# Patient Record
Sex: Female | Born: 1990 | Hispanic: Yes | Marital: Married | Smoking: Never smoker
Health system: Southern US, Community
[De-identification: ages and names within clinical notes are randomized; demographics above are authoritative.]

## PROBLEM LIST (undated history)

## (undated) ENCOUNTER — Inpatient Hospital Stay: Payer: Self-pay

## (undated) DIAGNOSIS — O139 Gestational [pregnancy-induced] hypertension without significant proteinuria, unspecified trimester: Secondary | ICD-10-CM

## (undated) HISTORY — PX: NO PAST SURGERIES: SHX2092

---

## 2014-08-24 NOTE — L&D Delivery Note (Signed)
Obstetrical Delivery Note   Date of Delivery:   07/04/2015 Primary OB:   [facility] Gestational Age[facility]ED[facility] 8026w2d [facility] Antepartum complications: no prenatal records available  Delivered By:   Farrel ConnersGUTIERREZ, Earlisha Sharples, CNM  Delivery Type:   spontaneous vaginal delivery  Procedure Details:   Patient arrived on unit with urge to push. She was complete with the fetal head at +2 station. SVD of a vigorous 6#6oz female infant at 2342 07/03/2015 over intact perineum. Anesthesia:    none Intrapartum complications: precipitous labor GBS:    unknown Laceration:    none Episiotomy:    none Placenta:    Via active 3rd stage. To pathology: no Estimated Blood Loss:  350 ml  Baby:    Liveborn female, Apgars 8/9, weight 6#6oz    Samia Kukla, CNM

## 2014-11-18 ENCOUNTER — Emergency Department: Payer: Self-pay | Admitting: Internal Medicine

## 2014-11-18 LAB — CBC WITH DIFFERENTIAL/PLATELET
BASOS ABS: 0 10*3/uL (ref 0.0–0.1)
BASOS PCT: 0.7 %
Eosinophil #: 0.2 10*3/uL (ref 0.0–0.7)
Eosinophil %: 3.6 %
HCT: 38.7 % (ref 35.0–47.0)
HGB: 12.3 g/dL (ref 12.0–16.0)
LYMPHS ABS: 1.9 10*3/uL (ref 1.0–3.6)
Lymphocyte %: 32.1 %
MCH: 25.8 pg — ABNORMAL LOW (ref 26.0–34.0)
MCHC: 31.8 g/dL — AB (ref 32.0–36.0)
MCV: 81 fL (ref 80–100)
MONOS PCT: 7.9 %
Monocyte #: 0.5 x10 3/mm (ref 0.2–0.9)
NEUTROS ABS: 3.3 10*3/uL (ref 1.4–6.5)
Neutrophil %: 55.7 %
Platelet: 268 10*3/uL (ref 150–440)
RBC: 4.78 10*6/uL (ref 3.80–5.20)
RDW: 15.2 % — ABNORMAL HIGH (ref 11.5–14.5)
WBC: 5.9 10*3/uL (ref 3.6–11.0)

## 2014-11-18 LAB — URINALYSIS, COMPLETE
BILIRUBIN, UR: NEGATIVE
Blood: NEGATIVE
GLUCOSE, UR: NEGATIVE mg/dL (ref 0–75)
Ketone: NEGATIVE
Nitrite: NEGATIVE
Ph: 5 (ref 4.5–8.0)
Protein: NEGATIVE
RBC,UR: 2 /HPF (ref 0–5)
Specific Gravity: 1.018 (ref 1.003–1.030)
Squamous Epithelial: 3

## 2014-11-18 LAB — COMPREHENSIVE METABOLIC PANEL
Albumin: 4.3 g/dL
Alkaline Phosphatase: 77 U/L
Anion Gap: 6 — ABNORMAL LOW (ref 7–16)
BUN: 11 mg/dL
Bilirubin,Total: 0.4 mg/dL
Calcium, Total: 9.1 mg/dL
Chloride: 106 mmol/L
Co2: 22 mmol/L
Creatinine: 0.6 mg/dL
EGFR (African American): >60
Glucose: 80 mg/dL
Potassium: 4.2 mmol/L
SGOT(AST): 24 U/L
SGPT (ALT): 22 U/L
SODIUM: 134 mmol/L — AB
TOTAL PROTEIN: 7.5 g/dL

## 2014-11-18 LAB — GC/CHLAMYDIA PROBE AMP

## 2014-11-18 LAB — HCG, QUANTITATIVE, PREGNANCY: Beta Hcg, Quant.: 35638 m[IU]/mL — ABNORMAL HIGH

## 2014-11-18 LAB — LIPASE, BLOOD: Lipase: 33 U/L

## 2014-11-18 LAB — WET PREP, GENITAL

## 2015-05-07 ENCOUNTER — Emergency Department: Payer: Self-pay

## 2015-05-07 ENCOUNTER — Emergency Department
Admission: EM | Admit: 2015-05-07 | Discharge: 2015-05-07 | Disposition: A | Discharge status: Home / Self Care | Payer: Self-pay | Attending: Emergency Medicine | Admitting: Emergency Medicine

## 2015-05-07 ENCOUNTER — Inpatient Hospital Stay
Admission: EM | Admit: 2015-05-07 | Discharge: 2015-05-08 | Disposition: A | Discharge status: Home / Self Care | Payer: Self-pay | Source: Ambulatory Visit | Attending: Obstetrics and Gynecology | Admitting: Obstetrics and Gynecology

## 2015-05-07 ENCOUNTER — Encounter: Payer: Self-pay | Admitting: Emergency Medicine

## 2015-05-07 DIAGNOSIS — Z3A28 28 weeks gestation of pregnancy: Secondary | ICD-10-CM | POA: Insufficient documentation

## 2015-05-07 DIAGNOSIS — O26899 Other specified pregnancy related conditions, unspecified trimester: Secondary | ICD-10-CM | POA: Insufficient documentation

## 2015-05-07 DIAGNOSIS — O26893 Other specified pregnancy related conditions, third trimester: Secondary | ICD-10-CM

## 2015-05-07 DIAGNOSIS — N898 Other specified noninflammatory disorders of vagina: Secondary | ICD-10-CM | POA: Insufficient documentation

## 2015-05-07 DIAGNOSIS — O9989 Other specified diseases and conditions complicating pregnancy, childbirth and the puerperium: Secondary | ICD-10-CM | POA: Insufficient documentation

## 2015-05-07 DIAGNOSIS — Z3A00 Weeks of gestation of pregnancy not specified: Secondary | ICD-10-CM | POA: Insufficient documentation

## 2015-05-07 DIAGNOSIS — O4693 Antepartum hemorrhage, unspecified, third trimester: Secondary | ICD-10-CM | POA: Insufficient documentation

## 2015-05-07 LAB — COMPREHENSIVE METABOLIC PANEL
ALK PHOS: 93 U/L (ref 38–126)
ALT: 9 U/L — AB (ref 14–54)
ANION GAP: 7 (ref 5–15)
AST: 20 U/L (ref 15–41)
Albumin: 3.1 g/dL — ABNORMAL LOW (ref 3.5–5.0)
BUN: 12 mg/dL (ref 6–20)
CALCIUM: 8.8 mg/dL — AB (ref 8.9–10.3)
CO2: 22 mmol/L (ref 22–32)
CREATININE: 0.61 mg/dL (ref 0.44–1.00)
Chloride: 107 mmol/L (ref 101–111)
Glucose, Bld: 75 mg/dL (ref 65–99)
Potassium: 4.2 mmol/L (ref 3.5–5.1)
Sodium: 136 mmol/L (ref 135–145)
TOTAL PROTEIN: 6.8 g/dL (ref 6.5–8.1)
Total Bilirubin: 0.5 mg/dL (ref 0.3–1.2)

## 2015-05-07 LAB — CBC WITH DIFFERENTIAL/PLATELET
Basophils Absolute: 0 10*3/uL (ref 0–0.1)
Basophils Relative: 0 %
EOS ABS: 0.1 10*3/uL (ref 0–0.7)
EOS PCT: 1 %
HCT: 32.4 % — ABNORMAL LOW (ref 35.0–47.0)
HEMOGLOBIN: 10.6 g/dL — AB (ref 12.0–16.0)
LYMPHS ABS: 2.3 10*3/uL (ref 1.0–3.6)
LYMPHS PCT: 28 %
MCH: 25.2 pg — AB (ref 26.0–34.0)
MCHC: 32.6 g/dL (ref 32.0–36.0)
MCV: 77.4 fL — AB (ref 80.0–100.0)
MONOS PCT: 7 %
Monocytes Absolute: 0.6 10*3/uL (ref 0.2–0.9)
NEUTROS PCT: 64 %
Neutro Abs: 5.2 10*3/uL (ref 1.4–6.5)
Platelets: 197 10*3/uL (ref 150–440)
RBC: 4.19 MIL/uL (ref 3.80–5.20)
RDW: 14.2 % (ref 11.5–14.5)
WBC: 8.1 10*3/uL (ref 3.6–11.0)

## 2015-05-07 LAB — URINALYSIS COMPLETE WITH MICROSCOPIC (ARMC ONLY)
BACTERIA UA: NONE SEEN
BILIRUBIN URINE: NEGATIVE
GLUCOSE, UA: NEGATIVE mg/dL
Ketones, ur: NEGATIVE mg/dL
NITRITE: NEGATIVE
Protein, ur: NEGATIVE mg/dL
SPECIFIC GRAVITY, URINE: 1.005 (ref 1.005–1.030)
pH: 6 (ref 5.0–8.0)

## 2015-05-07 LAB — CHLAMYDIA/NGC RT PCR (ARMC ONLY)
CHLAMYDIA TR: NOT DETECTED
N GONORRHOEAE: NOT DETECTED

## 2015-05-07 LAB — TYPE AND SCREEN
ABO/RH(D): B POS
ANTIBODY SCREEN: NEGATIVE

## 2015-05-07 LAB — WET PREP, GENITAL
Trich, Wet Prep: NONE SEEN
Yeast Wet Prep HPF POC: NONE SEEN

## 2015-05-07 LAB — HCG, QUANTITATIVE, PREGNANCY: hCG, Beta Chain, Quant, S: 18246 m[IU]/mL — ABNORMAL HIGH (ref <5)

## 2015-05-07 NOTE — ED Notes (Signed)
Pt to ed with c/o white vaginal d/c today, pt is 7 months pregnant, reports pain in pelvic area as well.  Denies vaginal bleeding, sent from health department.

## 2015-05-07 NOTE — ED Provider Notes (Signed)
[facility] Emergency Department Provider Note     Time seen: ----------------------------------------- 6:08 PM on 05/07/2015 -----------------------------------------    I have reviewed the triage vital signs and the nursing notes.   HISTORY  Chief Complaint Vaginal Discharge    HPI Sheryl Stanley is a 24 y.o. female who presents ER for vaginal discharge today. Reports she is around 7 months pregnant, reports she's had some pain in the pelvic area as well. She denies significant vaginal bleeding, was sent from the health department for same.She was sent here for evaluation.   History reviewed. No pertinent past medical history.  There are no active problems to display for this patient.   History reviewed. No pertinent past surgical history.  Allergies Review of patient's allergies indicates no known allergies.  Social History Social History  Substance Use Topics  . Smoking status: Never Smoker   . Smokeless tobacco: None  . Alcohol Use: No    Review of Systems Constitutional: Negative for fever. Eyes: Negative for visual changes. ENT: Negative for sore throat. Cardiovascular: Negative for chest pain. Respiratory: Negative for shortness of breath. Gastrointestinal: Positive for abdominal pain, negative for vomiting and diarrhea. Genitourinary: Negative for dysuria. Patient with vaginal bleeding and discharge Musculoskeletal: Negative for back pain. Skin: Negative for rash. Neurological: Negative for headaches, focal weakness or numbness.  10-point ROS otherwise negative.  ____________________________________________   PHYSICAL EXAM:  VITAL SIGNS: ED Triage Vitals  Enc Vitals Group     BP 05/07/15 1721 132/67 mmHg     Pulse Rate 05/07/15 1721 72     Resp 05/07/15 1721 20     Temp 05/07/15 1721 98.5 F (36.9 C)     Temp Source 05/07/15 1721 Oral     SpO2 05/07/15 1721 100 %     Weight 05/07/15 1721 158 lb (71.668 kg)      Height 05/07/15 1721  (1.651 m)     Head Cir --      Peak Flow --      Pain Score 05/07/15 1722 4     Pain Loc --      Pain Edu? --      Excl. in GC? --     Constitutional: Alert and oriented. Well appearing and in no distress. Eyes: Conjunctivae are normal. PERRL. Normal extraocular movements. ENT   Head: Normocephalic and atraumatic.   Nose: No congestion/rhinnorhea.   Mouth/Throat: Mucous membranes are moist.   Neck: No stridor. Cardiovascular: Normal rate, regular rhythm. Normal and symmetric distal pulses are present in all extremities. No murmurs, rubs, or gallops. Respiratory: Normal respiratory effort without tachypnea nor retractions. Breath sounds are clear and equal bilaterally. No wheezes/rales/rhonchi. Gastrointestinal: Pelvic tenderness, no rebound or guarding. Normal bowel sounds. Genitourinary: Pelvic exam reveals copious white with discharge with yellowish tinge, friable cervix, slight bleeding. Os appears closed Musculoskeletal: Nontender with normal range of motion in all extremities. No joint effusions.  No lower extremity tenderness nor edema. Neurologic:  Normal speech and language. No gross focal neurologic deficits are appreciated. Speech is normal. No gait instability. Skin:  Skin is warm, dry and intact. No rash noted. Psychiatric: Mood and affect are normal. Speech and behavior are normal. Patient exhibits appropriate insight and judgment.  ____________________________________________  ED COURSE:  Pertinent labs & imaging results that were available during my care of the patient were reviewed by me and considered in my medical decision making (see chart for details). Patient will need ultrasound and likely monitoring for contractions.  ____________________________________________    LABS (pertinent positives/negatives)  Labs Reviewed  WET PREP, GENITAL  CHLAMYDIA/NGC RT PCR ([facility] ONLY)  URINALYSIS COMPLETEWITH  MICROSCOPIC ([facility] ONLY)  CBC WITH DIFFERENTIAL/PLATELET  COMPREHENSIVE METABOLIC PANEL  HCG, QUANTITATIVE, PREGNANCY  TYPE AND SCREEN    RADIOLOGY Images were viewed by me  Ultrasound OB limited  ____________________________________________  FINAL ASSESSMENT AND PLAN  Third trimester pregnancy, vaginal bleeding, abdominal pain  Plan: Patient with labs and imaging as dictated above. Final disposition is pending at this time. Likely she'll need observation and monitoring for contractions.   Emily Filbert, MD   Emily Filbert, MD 05/07/15 [phone]

## 2015-05-07 NOTE — ED Notes (Signed)
Pt is 7 months pregnant reports white vaginal discharge with pressure, pt denies any other symptoms

## 2015-05-07 NOTE — Discharge Instructions (Signed)
Will send urine for culture.  Patient to schedule next appt at [facility].  Call for problems or concerns.

## 2015-05-07 NOTE — Discharge Instructions (Signed)
Dolor abdominal durante el embarazo (Abdominal Pain During Pregnancy) El dolor de vientre (abdominal) es habitual durante el embarazo. Generalmente no se trata de un problema grave. Otras veces puede ser un signo de que algo no anda bien. Siempre comunquese con su mdico si tiene dolor abdominal. CUIDADOS EN EL HOGAR Controle el dolor para ver si hay cambios. Las indicaciones que siguen pueden ayudarla a sentirse mejor:  Hospital doctor (relaciones sexuales) ni se coloque nada dentro de la vagina hasta que se sienta mejor.  Haga reposo hasta que el dolor se calme.  Si siente ganas de vomitar (nuseas ) beba lquidos claros. No consuma alimentos slidos hasta que se sienta mejor.  Slo tome los medicamentos que le haya indicado su mdico.  Cumpla con las visitas al mdico segn las indicaciones. SOLICITE AYUDA DE INMEDIATO SI:   Tiene un sangrado, pierde lquido o elimina trozos de tejido por la vagina.  Siente ms dolor o clicos.  Comienza a vomitar.  Siente dolor al orinar u observa sangre en la orina.  Tiene fiebre.  No siente que el beb se mueva mucho.  Se siente muy dbil o cree que va a desmayarse.  Tiene dificultad para respirar con o sin dolor en el vientre.  Siente un dolor de cabeza muy intenso y Engineer, mining en el vientre.  Observa que sale un lquido por la vagina y tiene dolor abdominal.  La materia fecal es lquida (diarrea).  El dolor en el viente no desaparece, o empeora, luego de hacer reposo. ASEGRESE DE QUE:   Comprende estas instrucciones.  Controlar su afeccin.  Recibir ayuda de inmediato si no mejora o si empeora. Document Released: 04/22/2011 Document Revised: 04/12/2013 [facility] Patient Information 2015 [place], [place]. This information is not intended to replace advice given to you by your health care provider. Make sure you discuss any questions you have with your health care provider.  Hemorragia vaginal durante el embarazo (tercer  trimestre) (Vaginal Bleeding During Pregnancy, Third Trimester) Durante el embarazo es relativamente frecuente que se presente una pequea hemorragia (manchas). Hay diversos factores que pueden causar hemorragia o Games developer. A veces, la hemorragia es normal y no es un problema. No obstante, si esto sucede durante el tercer trimestre, puede ser un signo de afeccin grave de la madre y el beb. Debe informar a su mdico de inmediato si tiene alguna hemorragia vaginal.  Algunas causas posibles de hemorragia vaginal durante el tercer trimestre incluyen:   La placenta puede estar cubriendo la apertura del tero de [place] total o parcial (placenta previa).  La placenta puede haberse separado del tero (abrupcin placentaria).  Puede haber infeccin o una masa en el cuello del tero.  Puede ser que se est iniciando el trabajo de parto, entonces se denomina "prdida del tapn mucoso".  La placenta puede desarrollarse en la capa muscular del tero (placenta acreta). INSTRUCCIONES PARA EL CUIDADO EN EL HOGAR  Controle su afeccin para ver si hay cambios. Las siguientes indicaciones ayudarn a Psychologist, educational [employer] pueda sentir:   Siga las indicaciones del mdico para restringir su actividad. Si el mdico le indica descanso en la cama, debe quedarse en la cama y levantarse solo para ir al bao. No obstante, el mdico puede permitirle que contine realizando tareas livianas.  Si es necesario, organcese para que alguien le ayude con las actividades y responsabilidades cotidianas mientras est en cama.  Lleve un registro de la cantidad y Scientist, forensic de saturacin de las toallas higinicas que [place]  cada da. Anote este dato.  No use tampones.No se haga duchas vaginales.  No tenga relaciones sexuales u orgasmos hasta que el mdico la autorice.  Siga los consejos del mdico acerca de levantar objetos pesados, conducir y Education officer, environmental actividad fsica.  Si elimina tejido por la vagina,  gurdelo para mostrrselo al [employer].  Tusculum solo medicamentos de venta libre o recetados, segn las indicaciones del mdico.  No  tome aspirina, ya que puede causar hemorragias.  Cumpla con todas las visitas de control, segn le indique su mdico. SOLICITE ATENCIN MDICA SI:  Tiene un sangrado vaginal en cualquier momento del embarazo.  Tiene calambres o dolores de [place].  Tiene fiebre y no puede bajarla con medicamentos. SOLICITE ATENCIN MDICA DE INMEDIATO SI:   Siente calambres intensos o dolor en la espalda o en el vientre (abdomen).  Tiene escalofros.  Tiene una prdida de lquido por la vagina.  Elimina cogulos grandes o tejido por la vagina.  La hemorragia aumenta.  Se siente mareada o dbil.  Se desmaya.  Siente que el beb se mueve menos o no se mueve en absoluto. ASEGRESE DE QUE:  Comprende estas instrucciones.  Controlar su afeccin.  Recibir ayuda de inmediato si no mejora o si empeora. Document Released: 05/20/2005 Document Revised: 08/15/2013 [facility] Patient Information 2015 [place], [place]. This information is not intended to replace advice given to you by your health care provider. Make sure you discuss any questions you have with your health care provider.

## 2015-05-08 LAB — ABO/RH: ABO/RH(D): B POS

## 2015-05-08 NOTE — OB Triage Note (Signed)
Patient was sent from ED after in depth evaluation after having been sent from [facility] for vaginal d/c.  After efm tracing and cervical exam, patient d/c to home.  Urine culture sent and will request records in the morning from [facility].

## 2015-05-09 LAB — URINE CULTURE

## 2015-07-03 ENCOUNTER — Inpatient Hospital Stay
Admission: EM | Admit: 2015-07-03 | Discharge: 2015-07-05 | DRG: 775 | Disposition: A | Discharge status: Home / Self Care | Payer: Medicaid Other | Attending: Obstetrics & Gynecology | Admitting: Obstetrics & Gynecology

## 2015-07-03 DIAGNOSIS — O26893 Other specified pregnancy related conditions, third trimester: Secondary | ICD-10-CM | POA: Diagnosis present

## 2015-07-03 DIAGNOSIS — R030 Elevated blood-pressure reading, without diagnosis of hypertension: Secondary | ICD-10-CM | POA: Diagnosis present

## 2015-07-03 DIAGNOSIS — O24419 Gestational diabetes mellitus in pregnancy, unspecified control: Secondary | ICD-10-CM | POA: Diagnosis present

## 2015-07-03 DIAGNOSIS — O24429 Gestational diabetes mellitus in childbirth, unspecified control: Principal | ICD-10-CM | POA: Diagnosis present

## 2015-07-03 MED ORDER — OXYTOCIN 10 UNIT/ML IJ SOLN
INTRAMUSCULAR | Status: AC
  Administered 2015-07-03: 10 [IU] via INTRAMUSCULAR
  Filled 2015-07-03: qty 1

## 2015-07-04 LAB — CHLAMYDIA/NGC RT PCR (ARMC ONLY)
CHLAMYDIA TR: NOT DETECTED
N gonorrhoeae: NOT DETECTED

## 2015-07-04 LAB — CBC
HCT: 33 % — ABNORMAL LOW (ref 35.0–47.0)
Hemoglobin: 10.3 g/dL — ABNORMAL LOW (ref 12.0–16.0)
MCH: 22.4 pg — AB (ref 26.0–34.0)
MCHC: 31.3 g/dL — AB (ref 32.0–36.0)
MCV: 71.5 fL — ABNORMAL LOW (ref 80.0–100.0)
PLATELETS: 194 10*3/uL (ref 150–440)
RBC: 4.61 MIL/uL (ref 3.80–5.20)
RDW: 16.5 % — ABNORMAL HIGH (ref 11.5–14.5)
WBC: 10.2 10*3/uL (ref 3.6–11.0)

## 2015-07-04 LAB — COMPREHENSIVE METABOLIC PANEL
ALT: 13 U/L — ABNORMAL LOW (ref 14–54)
AST: 29 U/L (ref 15–41)
Albumin: 2.8 g/dL — ABNORMAL LOW (ref 3.5–5.0)
Alkaline Phosphatase: 174 U/L — ABNORMAL HIGH (ref 38–126)
Anion gap: 8 (ref 5–15)
BUN: 12 mg/dL (ref 6–20)
CHLORIDE: 108 mmol/L (ref 101–111)
CO2: 19 mmol/L — ABNORMAL LOW (ref 22–32)
Calcium: 8.9 mg/dL (ref 8.9–10.3)
Creatinine, Ser: 0.7 mg/dL (ref 0.44–1.00)
Glucose, Bld: 91 mg/dL (ref 65–99)
POTASSIUM: 4.3 mmol/L (ref 3.5–5.1)
Sodium: 135 mmol/L (ref 135–145)
Total Bilirubin: 0.2 mg/dL — ABNORMAL LOW (ref 0.3–1.2)
Total Protein: 6.7 g/dL (ref 6.5–8.1)

## 2015-07-04 LAB — PROTEIN / CREATININE RATIO, URINE
CREATININE, URINE: 57 mg/dL
Protein Creatinine Ratio: 1.28 mg/mg{Cre} — ABNORMAL HIGH (ref 0.00–0.15)
Total Protein, Urine: 73 mg/dL

## 2015-07-04 LAB — DIFFERENTIAL
BASOS PCT: 1 %
Basophils Absolute: 0.1 10*3/uL (ref 0–0.1)
Eosinophils Absolute: 0.1 10*3/uL (ref 0–0.7)
Eosinophils Relative: 1 %
Lymphocytes Relative: 20 %
Lymphs Abs: 2 10*3/uL (ref 1.0–3.6)
Monocytes Absolute: 0.6 10*3/uL (ref 0.2–0.9)
Monocytes Relative: 5 %
NEUTROS PCT: 73 %
Neutro Abs: 7.4 10*3/uL — ABNORMAL HIGH (ref 1.4–6.5)

## 2015-07-04 LAB — RAPID HIV SCREEN (HIV 1/2 AB+AG)
HIV 1/2 ANTIBODIES: NONREACTIVE
HIV-1 P24 ANTIGEN - HIV24: NONREACTIVE

## 2015-07-04 MED ORDER — DOCUSATE SODIUM 100 MG PO CAPS
100.0000 mg | ORAL_CAPSULE | Freq: Every day | ORAL | Status: DC
  Administered 2015-07-04 – 2015-07-05 (×2): 100 mg via ORAL
  Filled 2015-07-04 (×2): qty 1

## 2015-07-04 MED ORDER — SIMETHICONE 80 MG PO CHEW: 80.0000 mg | CHEWABLE_TABLET | ORAL | Status: DC | PRN

## 2015-07-04 MED ORDER — BENZOCAINE-MENTHOL 20-0.5 % EX AERO: 1.0000 "application " | INHALATION_SPRAY | CUTANEOUS | Status: DC | PRN

## 2015-07-04 MED ORDER — HYDROCODONE-ACETAMINOPHEN 5-325 MG PO TABS
1.0000 | ORAL_TABLET | ORAL | Status: DC | PRN
  Administered 2015-07-04 (×2): 2 via ORAL
  Filled 2015-07-04 (×2): qty 2

## 2015-07-04 MED ORDER — IBUPROFEN 600 MG PO TABS
600.0000 mg | ORAL_TABLET | Freq: Four times a day (QID) | ORAL | Status: DC
  Administered 2015-07-04 – 2015-07-05 (×6): 600 mg via ORAL
  Filled 2015-07-04 (×6): qty 1

## 2015-07-04 MED ORDER — DIBUCAINE 1 % RE OINT: 1.0000 "application " | TOPICAL_OINTMENT | RECTAL | Status: DC | PRN

## 2015-07-04 MED ORDER — ONDANSETRON HCL 4 MG PO TABS: 4.0000 mg | ORAL_TABLET | ORAL | Status: DC | PRN

## 2015-07-04 MED ORDER — PRENATAL MULTIVITAMIN CH
1.0000 | ORAL_TABLET | Freq: Every day | ORAL | Status: DC
  Administered 2015-07-04 – 2015-07-05 (×2): 1 via ORAL
  Filled 2015-07-04 (×2): qty 1

## 2015-07-04 MED ORDER — ONDANSETRON HCL 4 MG/2ML IJ SOLN: 4.0000 mg | INTRAMUSCULAR | Status: DC | PRN

## 2015-07-04 MED ORDER — OXYTOCIN 10 UNIT/ML IJ SOLN: 10.0000 [IU] | Freq: Once | INTRAMUSCULAR | Status: DC

## 2015-07-04 MED ORDER — FERROUS SULFATE 325 (65 FE) MG PO TABS
325.0000 mg | ORAL_TABLET | Freq: Every day | ORAL | Status: DC
  Administered 2015-07-04 – 2015-07-05 (×2): 325 mg via ORAL
  Filled 2015-07-04 (×2): qty 1

## 2015-07-04 MED ORDER — ONDANSETRON HCL 4 MG/2ML IJ SOLN: 4.0000 mg | Freq: Four times a day (QID) | INTRAMUSCULAR | Status: DC | PRN

## 2015-07-04 MED ORDER — WITCH HAZEL-GLYCERIN EX PADS: 1.0000 "application " | MEDICATED_PAD | CUTANEOUS | Status: DC | PRN

## 2015-07-04 NOTE — Progress Notes (Signed)
Post Partum Day 1 Subjective: Doing well, no complaints.  Tolerating regular diet, pain with PO meds, voiding and ambulating without difficulty.  No CP SOB F/C N/V or leg pain no HA change of vision, RUQ/epigastric pain  Objective: BP 110/70 mmHg  Pulse 74  Temp(Src) 98.7 F (37.1 C) (Oral)  Resp 18  SpO2 100%   Physical Exam:  General: NAD CV: RRR Pulm: nl effort, CTABL Lochia: moderate Uterine Fundus: fundus firm and below umbilicus DVT Evaluation: no cords, ttp LEs    Recent Labs  07/04/15 0035  HGB 10.3*  HCT 33.0*  WBC 10.2  PLT 194    Assessment/Plan: 23 y.o. [id number] postpartum day # 1  1. Doing well, continue routine post partum cares 2. No signs/symptoms of worsening pre-eclampsia. 3. Anticipate discharge tomorrow. 4. Still awaiting prenatal records.    ----- Ranae Plumberhelsea Ward, MD Attending Obstetrician and Gynecologist [facility]

## 2015-07-04 NOTE — H&P (Signed)
OB History & Physical   History of Present Illness:  Chief Complaint:  Feels urge to push. Contractions started around 2200 tonight HPI:  Sheryl Stanley is a 24 y.o. [id number] female at 6020w2d by 07/15/2015 EDC. She arrived on L&D completely dilated and delivered her baby a few minutes later.   [facility] and no records are available. Per interpretor, patient reports having elevated blood pressures this pregnancy and GDM but is on no medication for same  [facility]      Maternal Medical History:  No past medical history on file.  No past surgical history on file.  No Known Allergies  Prior to Admission medications   Medication Sig Start Date End Date Taking? Authorizing Provider  Prenatal Vit-Fe Fumarate-FA (MULTIVITAMIN-PRENATAL) 27-0.8 MG TABS tablet Take 1 tablet by mouth daily at 12 noon.   Yes Historical Provider, MD          Social History: She  reports that she has never smoked. She does not have any smokeless tobacco history on file. She reports that she does not drink alcohol or use illicit drugs.  Family History: family history is not on file.   Review of Systems: Negative x 10 systems reviewed except as noted in the HPI.      Physical Exam:  Vital Signs:  Patient Vitals for the past 24 hrs:  BP Pulse  07/04/15 0040 (!) 149/93 mmHg 80  07/04/15 0026 135/82 mmHg 83  07/04/15 0011 (!) 149/84 mmHg 73  07/04/15 0002 (!) 144/77 mmHg 82  07/03/15 2356 (!) 151/81 mmHg 71  07/03/15 2351 (!) 143/77 mmHg 76   General: presented with urge to push, now calm and relaxed post delivery HEENT: normocephalic, atraumatic Heart: regular rate & rhythm.  No murmurs Lungs: clear to auscultation bilaterally Abdomen:nontender Pelvic:    Extremities: non-tender, symmetric, traceedema bilaterally.  DTRs: +3 Neurologic: Alert & oriented x 3.    Pertinent Results:  Prenatal  Labs: Blood type/Rh B positive  Antibody screen   Rubella   RPR   HBsAg   HIV   GC negative  Chlamydia negative  Genetic screening   1 hour GTT   3 hour GTT   GBS unknown      Assessment:  Sheryl Stanley is a 24 y.o. [phone] female s/p precipitous labor Elevated blood pressures postpartum ?CHTN vs gestational hypertension  Plan:  1. Admit  2. Prenatal labs and PIH labs 3. Postpartum care-bottle feeding 4. Obtain prenatal records in AM.  Sheryl ConnersGUTIERREZ, COLLEEN  07/04/2015 12:33 AM    ----- Ranae Plumberhelsea Maizey Menendez, MD Attending Obstetrician and Gynecologist [facility]

## 2015-07-05 LAB — ABO/RH: ABO/RH(D): B POS

## 2015-07-05 LAB — HEPATITIS B SURFACE ANTIGEN: Hepatitis B Surface Ag: NEGATIVE

## 2015-07-05 LAB — RUBELLA SCREEN: Rubella: 1.4 index (ref >0.99)

## 2015-07-05 LAB — RPR: RPR Ser Ql: NONREACTIVE

## 2015-07-05 LAB — VARICELLA ZOSTER ANTIBODY, IGG: Varicella IgG: 295 index (ref >165)

## 2015-07-05 MED ORDER — IBUPROFEN 600 MG PO TABS: 600.0000 mg | ORAL_TABLET | Freq: Four times a day (QID) | ORAL | Status: DC

## 2015-07-05 MED ORDER — NORETHINDRONE 0.35 MG PO TABS: 1.0000 | ORAL_TABLET | Freq: Every day | ORAL | Status: DC

## 2015-07-05 NOTE — Discharge Instructions (Signed)
Call your doctor for increased pain or vaginal bleeding, temperature above 100.4, depression, or concerns.  No strenuous activity or heavy lifting for 6 weeks.  No intercourse, tampons, douching, or enemas for 6 weeks.  No tub baths-showers only.  No driving for 2 weeks or while taking pain medications.  Continue prenatal vitamin and iron.   °

## 2015-07-05 NOTE — Discharge Summary (Signed)
OB Discharge Summary  Patient Name: Sheryl Stanley DOB: 1990-12-27 MRN: [id number]  Date of admission: 07/03/2015 Delivering MD: Thomasene MohairStephen Adna Nofziger, MD Date of Delivery: 07/03/2015  Date of discharge: 07/05/2015  Admitting diagnosis: 40 weeks contractions Intrauterine pregnancy: 7768w2d     Secondary diagnosis: None     Discharge diagnosis: Term Pregnancy Delivered                                                                                                Post partum procedures:None  Augmentation: none  Complications: None  Hospital course:  Onset of Labor With Vaginal Delivery     24 y.o. yo [id number] at 6368w2d was admitted in Active Laboron 07/03/2015. Patient had an uncomplicated labor course as follows:  Membrane Rupture Time/Date: 11:42 PM ,07/03/2015   Intrapartum Procedures: Episiotomy: None [1]                                         Lacerations:  None [1]  Patient had a delivery of a Viable infant. 07/03/2015  Information for the patient's newborn:  Sheryl Stanley, Girl Sheryl Stanley [[id number]][[id number]]  Delivery Method: Vag-Spont     Pateint had an uncomplicated postpartum course.  She is ambulating, tolerating a regular diet, passing flatus, and urinating well. Patient is discharged home in stable condition on No discharge date for patient encounter.Marland Kitchen.    Physical exam  Filed Vitals:   07/04/15 1613 07/04/15 1930 07/04/15 2330 07/05/15 0734  BP: 106/56 122/80 119/67 103/66  Pulse: 70 77 72 71  Temp: 98.4 F (36.9 C) 98 F (36.7 C) 97.9 F (36.6 C) 98 F (36.7 C)  TempSrc:  Oral Oral Oral  Resp: 18 20 20 20   Height:      Weight:      SpO2:  100% 100% 100%   General: alert Lochia: appropriate Uterine Fundus: firm Incision: N/A DVT Evaluation: No evidence of DVT seen on physical exam.  Labs: Lab Results  Component Value Date   WBC 10.2 07/04/2015   HGB 10.3* 07/04/2015   HCT 33.0* 07/04/2015   MCV 71.5* 07/04/2015   PLT 194 07/04/2015   CMP Latest Ref Rng  07/04/2015  Glucose 65 - 99 mg/dL 91  BUN 6 - 20 mg/dL 12  Creatinine 1.300.44 - 8.651.00 mg/dL 7.840.70  Sodium 696135 - 295145 mmol/L 135  Potassium 3.5 - 5.1 mmol/L 4.3  Chloride 101 - 111 mmol/L 108  CO2 22 - 32 mmol/L 19(L)  Calcium 8.9 - 10.3 mg/dL 8.9  Total Protein 6.5 - 8.1 g/dL 6.7  Total Bilirubin 0.3 - 1.2 mg/dL 2.8(U0.2(L)  Alkaline Phos 38 - 126 U/L 174(H)  AST 15 - 41 U/L 29  ALT 14 - 54 U/L 13(L)    Discharge instruction: per After Visit Summary.  Medications:    Medication List    TAKE these medications        ibuprofen 600 MG tablet  Commonly known as:  ADVIL,MOTRIN  Take 1 tablet (600 mg total) by mouth every 6 (  six) hours.     multivitamin-prenatal 27-0.8 MG Tabs tablet  Take 1 tablet by mouth daily at 12 noon.     norethindrone 0.35 MG tablet  Commonly known as:  ORTHO MICRONOR  Take 1 tablet (0.35 mg total) by mouth daily.        Diet: routine diet  Activity: Advance as tolerated. Pelvic rest for 6 weeks.   Outpatient follow up: Follow-up Information    Follow up with [facility] In 6 weeks.   Why:  postpartum check   Contact information:   [address] [place] [address] [phone]      Postpartum contraception: Progesterone only pills Rhogam Given postpartum: no Rubella vaccine given postpartum: no Varicella vaccine given postpartum: no TDaP given antepartum or postpartum: TDaP per patient (no prenatal records available) Flu vaccine given antepartum or postpartum: yes per patient  Newborn Data: Live born female  Birth Weight: 6 lb 6.3 oz (2900 g) APGAR: 8, 9   Baby Feeding: Bottle, Breast and formula  Disposition:home with mother  Conard Novak, MD 07/05/2015 9:46 AM

## 2015-07-05 NOTE — Progress Notes (Addendum)
pt discharged home with infant.  Discharge instructions, prescriptions and follow up appointment given to and reviewed with pt.  Pt verbalized understanding, all questions answered.  Escorted by auxiliary. 

## 2015-07-05 NOTE — Care Management (Signed)
Received referral for wanting more information on Medicaid. Spoke with Sheryl Stanley at the bedside thru an interpreter. Explained that Ms. Sheryl Stanley will need to contact the Department of Social Services and arrange an appointment to start the application  process for baby girl "[name]." Hard copy of information given to Ms. Sheryl Clasivera.  Nurse updated. Gwenette GreetBrenda S Aldea Avis RN MSN CCM Care Management

## 2016-04-28 ENCOUNTER — Emergency Department
Admission: EM | Admit: 2016-04-28 | Discharge: 2016-04-28 | Discharge status: Against Medical Advice | Payer: Medicaid Other

## 2016-04-28 NOTE — ED Notes (Signed)
No answer when called from lobby 

## 2017-09-27 ENCOUNTER — Emergency Department: Payer: No Typology Code available for payment source

## 2017-09-27 ENCOUNTER — Other Ambulatory Visit: Payer: Self-pay

## 2017-09-27 ENCOUNTER — Emergency Department
Admission: EM | Admit: 2017-09-27 | Discharge: 2017-09-27 | Disposition: A | Discharge status: Home / Self Care | Payer: No Typology Code available for payment source | Attending: Emergency Medicine | Admitting: Emergency Medicine

## 2017-09-27 ENCOUNTER — Encounter: Payer: Self-pay | Admitting: Emergency Medicine

## 2017-09-27 DIAGNOSIS — R51 Headache: Secondary | ICD-10-CM | POA: Insufficient documentation

## 2017-09-27 DIAGNOSIS — Z79899 Other long term (current) drug therapy: Secondary | ICD-10-CM | POA: Insufficient documentation

## 2017-09-27 DIAGNOSIS — R1084 Generalized abdominal pain: Secondary | ICD-10-CM | POA: Insufficient documentation

## 2017-09-27 LAB — CBC
HEMATOCRIT: 34.3 % — AB (ref 35.0–47.0)
Hemoglobin: 11.5 g/dL — ABNORMAL LOW (ref 12.0–16.0)
MCH: 27.8 pg (ref 26.0–34.0)
MCHC: 33.4 g/dL (ref 32.0–36.0)
MCV: 83.1 fL (ref 80.0–100.0)
Platelets: 214 10*3/uL (ref 150–440)
RBC: 4.13 MIL/uL (ref 3.80–5.20)
RDW: 14.7 % — AB (ref 11.5–14.5)
WBC: 4.3 10*3/uL (ref 3.6–11.0)

## 2017-09-27 LAB — COMPREHENSIVE METABOLIC PANEL
ALT: 21 U/L (ref 14–54)
ANION GAP: 5 (ref 5–15)
AST: 33 U/L (ref 15–41)
Albumin: 4.2 g/dL (ref 3.5–5.0)
Alkaline Phosphatase: 73 U/L (ref 38–126)
BUN: 8 mg/dL (ref 6–20)
CO2: 25 mmol/L (ref 22–32)
Calcium: 9.3 mg/dL (ref 8.9–10.3)
Chloride: 110 mmol/L (ref 101–111)
Creatinine, Ser: 0.85 mg/dL (ref 0.44–1.00)
GFR calc Af Amer: >60 mL/min (ref >60)
Glucose, Bld: 100 mg/dL — ABNORMAL HIGH (ref 65–99)
POTASSIUM: 4.1 mmol/L (ref 3.5–5.1)
Sodium: 140 mmol/L (ref 135–145)
TOTAL PROTEIN: 7.8 g/dL (ref 6.5–8.1)
Total Bilirubin: 1.1 mg/dL (ref 0.3–1.2)

## 2017-09-27 LAB — POCT PREGNANCY, URINE: Preg Test, Ur: NEGATIVE

## 2017-09-27 MED ORDER — IBUPROFEN 600 MG PO TABS: 600.0000 mg | ORAL_TABLET | Freq: Four times a day (QID) | ORAL | 0 refills | Status: DC | PRN

## 2017-09-27 MED ORDER — IOPAMIDOL (ISOVUE-300) INJECTION 61%
100.0000 mL | Freq: Once | INTRAVENOUS | Status: AC | PRN
  Administered 2017-09-27: 100 mL via INTRAVENOUS
  Filled 2017-09-27: qty 100

## 2017-09-27 MED ORDER — IOPAMIDOL (ISOVUE-370) INJECTION 76%
100.0000 mL | Freq: Once | INTRAVENOUS | Status: DC | PRN
  Filled 2017-09-27: qty 100

## 2017-09-27 MED ORDER — CYCLOBENZAPRINE HCL 5 MG PO TABS: ORAL_TABLET | ORAL | 0 refills | Status: DC
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
## 2017-09-27 NOTE — ED Notes (Signed)
poc preg test  neg
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
## 2019-09-09 ENCOUNTER — Encounter: Payer: Self-pay | Admitting: Radiology

## 2019-09-09 ENCOUNTER — Emergency Department: Payer: Self-pay

## 2019-09-09 ENCOUNTER — Emergency Department
Admission: EM | Admit: 2019-09-09 | Discharge: 2019-09-09 | Disposition: A | Discharge status: Home / Self Care | Payer: Self-pay | Attending: Emergency Medicine | Admitting: Emergency Medicine

## 2019-09-09 ENCOUNTER — Other Ambulatory Visit: Payer: Self-pay

## 2019-09-09 DIAGNOSIS — R52 Pain, unspecified: Secondary | ICD-10-CM

## 2019-09-09 DIAGNOSIS — Y998 Other external cause status: Secondary | ICD-10-CM | POA: Insufficient documentation

## 2019-09-09 DIAGNOSIS — Y9389 Activity, other specified: Secondary | ICD-10-CM | POA: Insufficient documentation

## 2019-09-09 DIAGNOSIS — T07XXXA Unspecified multiple injuries, initial encounter: Secondary | ICD-10-CM

## 2019-09-09 DIAGNOSIS — M542 Cervicalgia: Secondary | ICD-10-CM | POA: Insufficient documentation

## 2019-09-09 DIAGNOSIS — M549 Dorsalgia, unspecified: Secondary | ICD-10-CM | POA: Insufficient documentation

## 2019-09-09 DIAGNOSIS — Z79899 Other long term (current) drug therapy: Secondary | ICD-10-CM | POA: Insufficient documentation

## 2019-09-09 DIAGNOSIS — Y92018 Other place in single-family (private) house as the place of occurrence of the external cause: Secondary | ICD-10-CM | POA: Insufficient documentation

## 2019-09-09 DIAGNOSIS — S3991XA Unspecified injury of abdomen, initial encounter: Secondary | ICD-10-CM

## 2019-09-09 DIAGNOSIS — S060X1A Concussion with loss of consciousness of 30 minutes or less, initial encounter: Secondary | ICD-10-CM

## 2019-09-09 DIAGNOSIS — H538 Other visual disturbances: Secondary | ICD-10-CM | POA: Insufficient documentation

## 2019-09-09 LAB — COMPREHENSIVE METABOLIC PANEL
ALT: 25 U/L (ref 0–44)
AST: 28 U/L (ref 15–41)
Albumin: 4.4 g/dL (ref 3.5–5.0)
Alkaline Phosphatase: 76 U/L (ref 38–126)
Anion gap: 13 (ref 5–15)
BUN: 10 mg/dL (ref 6–20)
CO2: 18 mmol/L — ABNORMAL LOW (ref 22–32)
Calcium: 9.3 mg/dL (ref 8.9–10.3)
Chloride: 113 mmol/L — ABNORMAL HIGH (ref 98–111)
Creatinine, Ser: 0.72 mg/dL (ref 0.44–1.00)
GFR calc Af Amer: >60 mL/min (ref >60)
GFR calc non Af Amer: >60 mL/min (ref >60)
Glucose, Bld: 105 mg/dL — ABNORMAL HIGH (ref 70–99)
Potassium: 3.6 mmol/L (ref 3.5–5.1)
Sodium: 144 mmol/L (ref 135–145)
Total Bilirubin: 0.3 mg/dL (ref 0.3–1.2)
Total Protein: 8.1 g/dL (ref 6.5–8.1)

## 2019-09-09 LAB — CBC WITH DIFFERENTIAL/PLATELET
Abs Immature Granulocytes: 0.01 10*3/uL (ref 0.00–0.07)
Basophils Absolute: 0 10*3/uL (ref 0.0–0.1)
Basophils Relative: 1 %
Eosinophils Absolute: 0.1 10*3/uL (ref 0.0–0.5)
Eosinophils Relative: 3 %
HCT: 37.6 % (ref 36.0–46.0)
Hemoglobin: 12 g/dL (ref 12.0–15.0)
Immature Granulocytes: 0 %
Lymphocytes Relative: 22 %
Lymphs Abs: 1 10*3/uL (ref 0.7–4.0)
MCH: 25.6 pg — ABNORMAL LOW (ref 26.0–34.0)
MCHC: 31.9 g/dL (ref 30.0–36.0)
MCV: 80.2 fL (ref 80.0–100.0)
Monocytes Absolute: 0.2 10*3/uL (ref 0.1–1.0)
Monocytes Relative: 5 %
Neutro Abs: 3 10*3/uL (ref 1.7–7.7)
Neutrophils Relative %: 69 %
Platelets: 289 10*3/uL (ref 150–400)
RBC: 4.69 MIL/uL (ref 3.87–5.11)
RDW: 15.5 % (ref 11.5–15.5)
WBC: 4.3 10*3/uL (ref 4.0–10.5)
nRBC: 0 % (ref 0.0–0.2)

## 2019-09-09 LAB — POCT PREGNANCY, URINE: Preg Test, Ur: NEGATIVE

## 2019-09-09 MED ORDER — IOHEXOL 300 MG/ML  SOLN
100.0000 mL | Freq: Once | INTRAMUSCULAR | Status: AC | PRN
  Administered 2019-09-09: 10:00:00 100 mL via INTRAVENOUS
  Filled 2019-09-09: qty 100

## 2019-09-09 MED ORDER — HYDROCODONE-ACETAMINOPHEN 5-325 MG PO TABS: 1.0000 | ORAL_TABLET | Freq: Four times a day (QID) | ORAL | 0 refills | Status: DC | PRN

## 2019-09-09 MED ORDER — ONDANSETRON HCL 4 MG/2ML IJ SOLN
4.0000 mg | Freq: Once | INTRAMUSCULAR | Status: AC
  Administered 2019-09-09: 09:00:00 4 mg via INTRAVENOUS
  Filled 2019-09-09: qty 2

## 2019-09-09 MED ORDER — MELOXICAM 15 MG PO TABS: 15.0000 mg | ORAL_TABLET | Freq: Every day | ORAL | 2 refills | Status: DC

## 2019-09-09 MED ORDER — MORPHINE SULFATE (PF) 4 MG/ML IV SOLN
4.0000 mg | Freq: Once | INTRAVENOUS | Status: AC
  Administered 2019-09-09: 09:00:00 4 mg via INTRAVENOUS
  Filled 2019-09-09: qty 1
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
## 2020-05-24 ENCOUNTER — Other Ambulatory Visit: Payer: Self-pay | Admitting: Family Medicine

## 2020-05-24 DIAGNOSIS — Z3482 Encounter for supervision of other normal pregnancy, second trimester: Secondary | ICD-10-CM

## 2020-08-05 ENCOUNTER — Encounter: Payer: Self-pay | Admitting: Obstetrics & Gynecology

## 2020-08-05 ENCOUNTER — Inpatient Hospital Stay
Admission: EM | Admit: 2020-08-05 | Discharge: 2020-08-07 | DRG: 806 | Disposition: A | Discharge status: Home / Self Care | Payer: Medicaid Other | Attending: Obstetrics and Gynecology | Admitting: Obstetrics and Gynecology

## 2020-08-05 ENCOUNTER — Other Ambulatory Visit: Payer: Self-pay

## 2020-08-05 DIAGNOSIS — O9081 Anemia of the puerperium: Secondary | ICD-10-CM | POA: Diagnosis not present

## 2020-08-05 DIAGNOSIS — O4103X0 Oligohydramnios, third trimester, not applicable or unspecified: Secondary | ICD-10-CM | POA: Diagnosis present

## 2020-08-05 DIAGNOSIS — Z20822 Contact with and (suspected) exposure to covid-19: Secondary | ICD-10-CM | POA: Diagnosis present

## 2020-08-05 DIAGNOSIS — O4292 Full-term premature rupture of membranes, unspecified as to length of time between rupture and onset of labor: Secondary | ICD-10-CM | POA: Diagnosis present

## 2020-08-05 DIAGNOSIS — Z3A38 38 weeks gestation of pregnancy: Secondary | ICD-10-CM | POA: Diagnosis not present

## 2020-08-05 DIAGNOSIS — O26893 Other specified pregnancy related conditions, third trimester: Secondary | ICD-10-CM | POA: Diagnosis present

## 2020-08-05 DIAGNOSIS — D62 Acute posthemorrhagic anemia: Secondary | ICD-10-CM | POA: Diagnosis not present

## 2020-08-05 HISTORY — DX: Gestational (pregnancy-induced) hypertension without significant proteinuria, unspecified trimester: O13.9

## 2020-08-05 LAB — COMPREHENSIVE METABOLIC PANEL
ALT: 15 U/L (ref 0–44)
AST: 26 U/L (ref 15–41)
Albumin: 3 g/dL — ABNORMAL LOW (ref 3.5–5.0)
Alkaline Phosphatase: 219 U/L — ABNORMAL HIGH (ref 38–126)
Anion gap: 9 (ref 5–15)
BUN: 9 mg/dL (ref 6–20)
CO2: 20 mmol/L — ABNORMAL LOW (ref 22–32)
Calcium: 8.9 mg/dL (ref 8.9–10.3)
Chloride: 105 mmol/L (ref 98–111)
Creatinine, Ser: 0.52 mg/dL (ref 0.44–1.00)
GFR, Estimated: >60 mL/min (ref >60)
Glucose, Bld: 81 mg/dL (ref 70–99)
Potassium: 3.9 mmol/L (ref 3.5–5.1)
Sodium: 134 mmol/L — ABNORMAL LOW (ref 135–145)
Total Bilirubin: 0.5 mg/dL (ref 0.3–1.2)
Total Protein: 7 g/dL (ref 6.5–8.1)

## 2020-08-05 LAB — RESP PANEL BY RT-PCR (FLU A&B, COVID) ARPGX2
Influenza A by PCR: NEGATIVE
Influenza B by PCR: NEGATIVE
SARS Coronavirus 2 by RT PCR: NEGATIVE

## 2020-08-05 LAB — OB RESULTS CONSOLE GC/CHLAMYDIA
Chlamydia: NEGATIVE
Gonorrhea: NEGATIVE

## 2020-08-05 MED ORDER — LACTATED RINGERS IV SOLN: INTRAVENOUS | Status: DC

## 2020-08-05 MED ORDER — MISOPROSTOL 200 MCG PO TABS
ORAL_TABLET | ORAL | Status: AC
  Administered 2020-08-05: 800 ug
  Filled 2020-08-05: qty 4

## 2020-08-05 MED ORDER — METHYLERGONOVINE MALEATE 0.2 MG/ML IJ SOLN
INTRAMUSCULAR | Status: AC
  Administered 2020-08-06: 01:00:00 0.2 mg
  Filled 2020-08-05: qty 1

## 2020-08-05 MED ORDER — SOD CITRATE-CITRIC ACID 500-334 MG/5ML PO SOLN: 30.0000 mL | ORAL | Status: DC | PRN

## 2020-08-05 MED ORDER — AMMONIA AROMATIC IN INHA
RESPIRATORY_TRACT | Status: AC
  Filled 2020-08-05: qty 10

## 2020-08-05 MED ORDER — CARBOPROST TROMETHAMINE 250 MCG/ML IM SOLN
INTRAMUSCULAR | Status: AC
  Administered 2020-08-06: 01:00:00 250 ug
  Filled 2020-08-05: qty 1

## 2020-08-05 MED ORDER — OXYTOCIN BOLUS FROM INFUSION
333.0000 mL | Freq: Once | INTRAVENOUS | Status: AC
  Administered 2020-08-05: 333 mL via INTRAVENOUS

## 2020-08-05 MED ORDER — LIDOCAINE HCL (PF) 1 % IJ SOLN
30.0000 mL | INTRAMUSCULAR | Status: DC | PRN
  Filled 2020-08-05: qty 30

## 2020-08-05 MED ORDER — ONDANSETRON HCL 4 MG/2ML IJ SOLN: 4.0000 mg | Freq: Four times a day (QID) | INTRAMUSCULAR | Status: DC | PRN

## 2020-08-05 MED ORDER — OXYTOCIN 10 UNIT/ML IJ SOLN
INTRAMUSCULAR | Status: AC
  Filled 2020-08-05: qty 2

## 2020-08-05 MED ORDER — OXYTOCIN-SODIUM CHLORIDE 30-0.9 UT/500ML-% IV SOLN
2.5000 [IU]/h | INTRAVENOUS | Status: DC
  Filled 2020-08-05 (×2): qty 500

## 2020-08-05 MED ORDER — LACTATED RINGERS IV SOLN
500.0000 mL | INTRAVENOUS | Status: DC | PRN
  Administered 2020-08-06: 01:00:00 1000 mL via INTRAVENOUS

## 2020-08-05 MED ORDER — FENTANYL CITRATE (PF) 100 MCG/2ML IJ SOLN
INTRAMUSCULAR | Status: AC
  Administered 2020-08-05: 100 ug via INTRAVENOUS
  Filled 2020-08-05: qty 2

## 2020-08-05 MED ORDER — ACETAMINOPHEN 325 MG PO TABS: 650.0000 mg | ORAL_TABLET | ORAL | Status: DC | PRN

## 2020-08-05 NOTE — H&P (Signed)
OB History & Physical   History of Present Illness:  Chief Complaint: painful Contractions  HPI:  Sheryl Stanley is a 29 y.o. [phone] female at 38.6wks dated by LMP and c/w [place] at 30wks done at [facility].  She presents to L&D for painful UCs since 8pm, reports leaking clear fluid for 2 days, since Sat eve around 8pm 08/03/20. Reports active FM.     Pregnancy Issues: 1. NO Prenatal records on file- care received at [facility].  2. Prior hx Pre-E with 2016 and 2020 babies 3. Grand multiparity 4. Spanish speaking only.  5. Oligohydramnios, last AFI 6cm per [facility] ultrasound record on 07/17/20 6. Hx preterm birth at 45wks with G1  Maternal Medical History:   Past Medical History:  Diagnosis Date  . Pregnancy induced hypertension     Past Surgical History:  Procedure Laterality Date  . NO PAST SURGERIES      No Known Allergies  Prior to Admission medications   Medication Sig Start Date End Date Taking? Authorizing Provider  cyclobenzaprine (FLEXERIL) 5 MG tablet Take 1-2 tablets 3 times daily as needed 09/27/17   Enid Derry, PA-C  HYDROcodone-acetaminophen (NORCO/VICODIN) 5-325 MG tablet Take 1 tablet by mouth every 6 (six) hours as needed for moderate pain. 09/09/19   Fisher, Roselyn Bering, PA-C  ibuprofen (ADVIL,MOTRIN) 600 MG tablet Take 1 tablet (600 mg total) by mouth every 6 (six) hours as needed. 09/27/17   Enid Derry, PA-C  meloxicam (MOBIC) 15 MG tablet Take 1 tablet (15 mg total) by mouth daily. 09/09/19 09/08/20  Fisher, Roselyn Bering, PA-C  norethindrone (ORTHO MICRONOR) 0.35 MG tablet Take 1 tablet (0.35 mg total) by mouth daily. 07/05/15   Conard Novak, MD  Prenatal Vit-Fe Fumarate-FA (MULTIVITAMIN-PRENATAL) 27-0.8 MG TABS tablet Take 1 tablet by mouth daily at 12 noon.    [provider]     Prenatal care site:  [facility]  Social History: She  reports that she has never smoked. She has never used smokeless tobacco.  She reports that she does not drink alcohol and does not use drugs.  Family History: no family hx Gyn cancers  Review of Systems: A full review of systems was performed and negative except as noted in the HPI.     Physical Exam:  Vital Signs: BP 138/80 (BP Location: Right Arm)   Pulse 91   Temp 97.8 F (36.6 C) (Oral)   Resp 20   Ht 5\' 2"  (1.575 m)   Wt 97.5 kg   LMP 11/07/2019 (Approximate)   SpO2 99%   BMI 39.32 kg/m  General: no acute distress.  HEENT: normocephalic, atraumatic Heart: regular rate & rhythm.  No murmurs/rubs/gallops Lungs: clear to auscultation bilaterally, normal respiratory effort Abdomen: soft, gravid, non-tender;  EFW: 7lbs Pelvic:   External: Normal external female genitalia  Cervix: 7-8cm per RN exam with BBOW forebag.    Extremities: non-tender, symmetric, no edema bilaterally.  DTRs: 2+  Neurologic: Alert & oriented x 3.    No results found for this or any previous visit (from the past 24 hour(s)).  Pertinent Results:  Prenatal Labs: No prenatal records available, initial OB labs drawn on admit.   Blood type/Rh B Pos  Antibody screen   Rubella   Varicella   RPR   HBsAg   HIV   GC   Chlamydia   Genetic screening   1 hour GTT   3 hour GTT   GBS    [facility]  ultrasound:  06/05/20: US performed, anatomy WNL at 30wks on 06/05/20, anterior placenta.  07/03/20: 2620grams, EFW 45%, AFI 8.8cm 07/17/20: AFI 6.1cm, cephalic   FHT: 140bpm, moderate variability, + accels, no decels TOCO: q2-73min, palp mod.    Cephalic by leopolds and exam.   No results found.  Assessment:  Sheryl Stanley is a 29 y.o. [phone] female at [redacted]w[redacted]d  Plan:  1. Admit to Labor & Delivery; consents reviewed and obtained - COVID on admit.  - GBS unknown. PCR obtained.    2. Fetal Well being  - Fetal Tracing: Cat I tracing - Group B Streptococcus ppx indicated: PCR pending, low risk, term pregnancy.  - Presentation: cephalic confirmed by exam and Leopolds    3. Routine OB: - Prenatal labs reviewed, as above - Rh B pos - CBC, T&S, RPR on admit - Clear fluids, IVF  4. Monitoring of Labor -  Contractions: external toco in place -  Pelvis proven to 8lbs -  Plan for induction, AROM forebag.  -  Plan for continuous fetal monitoring  -  Maternal pain control as desired - Anticipate vaginal delivery  5. Post Partum Planning: - Infant feeding: bpth - Contraception: TBD - tdap and flu: unknown  Randa Ngo, CNM 08/05/20 11:21 PM

## 2020-08-06 ENCOUNTER — Encounter: Payer: Self-pay | Admitting: Obstetrics & Gynecology

## 2020-08-06 LAB — CBC
HCT: 30.7 % — ABNORMAL LOW (ref 36.0–46.0)
HCT: 22.6 % — ABNORMAL LOW (ref 36.0–46.0)
Hemoglobin: 9.2 g/dL — ABNORMAL LOW (ref 12.0–15.0)
Hemoglobin: 6.9 g/dL — ABNORMAL LOW (ref 12.0–15.0)
MCH: 21.2 pg — ABNORMAL LOW (ref 26.0–34.0)
MCH: 21.8 pg — ABNORMAL LOW (ref 26.0–34.0)
MCHC: 30 g/dL (ref 30.0–36.0)
MCHC: 30.5 g/dL (ref 30.0–36.0)
MCV: 70.9 fL — ABNORMAL LOW (ref 80.0–100.0)
MCV: 71.3 fL — ABNORMAL LOW (ref 80.0–100.0)
Platelets: 225 10*3/uL (ref 150–400)
Platelets: 195 10*3/uL (ref 150–400)
RBC: 4.33 MIL/uL (ref 3.87–5.11)
RBC: 3.17 MIL/uL — ABNORMAL LOW (ref 3.87–5.11)
RDW: 17.1 % — ABNORMAL HIGH (ref 11.5–15.5)
RDW: 17 % — ABNORMAL HIGH (ref 11.5–15.5)
WBC: 6.5 10*3/uL (ref 4.0–10.5)
WBC: 14.8 10*3/uL — ABNORMAL HIGH (ref 4.0–10.5)
nRBC: 0 % (ref 0.0–0.2)
nRBC: 0.1 % (ref 0.0–0.2)

## 2020-08-06 LAB — CHLAMYDIA/NGC RT PCR (ARMC ONLY)
Chlamydia Tr: NOT DETECTED
N gonorrhoeae: NOT DETECTED

## 2020-08-06 LAB — PROTEIN / CREATININE RATIO, URINE
Creatinine, Urine: 79 mg/dL
Protein Creatinine Ratio: 0.15 mg/mg{Cre} (ref 0.00–0.15)
Total Protein, Urine: 12 mg/dL

## 2020-08-06 LAB — HEPATITIS B SURFACE ANTIGEN: Hepatitis B Surface Ag: NONREACTIVE

## 2020-08-06 LAB — RAPID HIV SCREEN (HIV 1/2 AB+AG)
HIV 1/2 Antibodies: NONREACTIVE
HIV-1 P24 Antigen - HIV24: NONREACTIVE

## 2020-08-06 LAB — RPR: RPR Ser Ql: NONREACTIVE

## 2020-08-06 LAB — HEMOGLOBIN AND HEMATOCRIT, BLOOD
HCT: 25.7 % — ABNORMAL LOW (ref 36.0–46.0)
Hemoglobin: 8.3 g/dL — ABNORMAL LOW (ref 12.0–15.0)

## 2020-08-06 LAB — GROUP B STREP BY PCR: Group B strep by PCR: NEGATIVE

## 2020-08-06 MED ORDER — KETOROLAC TROMETHAMINE 30 MG/ML IJ SOLN
INTRAMUSCULAR | Status: AC
  Filled 2020-08-06: qty 1

## 2020-08-06 MED ORDER — TETANUS-DIPHTH-ACELL PERTUSSIS 5-2.5-18.5 LF-MCG/0.5 IM SUSY
0.5000 mL | PREFILLED_SYRINGE | Freq: Once | INTRAMUSCULAR | Status: DC
  Filled 2020-08-06: qty 0.5

## 2020-08-06 MED ORDER — OXYCODONE HCL 5 MG PO TABS
5.0000 mg | ORAL_TABLET | ORAL | Status: DC | PRN
  Administered 2020-08-06 (×2): 10 mg via ORAL
  Filled 2020-08-06 (×2): qty 2

## 2020-08-06 MED ORDER — COCONUT OIL OIL: 1.0000 "application " | TOPICAL_OIL | Status: DC | PRN

## 2020-08-06 MED ORDER — SENNOSIDES-DOCUSATE SODIUM 8.6-50 MG PO TABS
2.0000 | ORAL_TABLET | ORAL | Status: DC
  Administered 2020-08-06 – 2020-08-07 (×2): 2 via ORAL
  Filled 2020-08-06 (×2): qty 2

## 2020-08-06 MED ORDER — WITCH HAZEL-GLYCERIN EX PADS
1.0000 "application " | MEDICATED_PAD | CUTANEOUS | Status: DC | PRN
  Administered 2020-08-07: 1 via TOPICAL
  Filled 2020-08-06: qty 100

## 2020-08-06 MED ORDER — BENZOCAINE-MENTHOL 20-0.5 % EX AERO
1.0000 "application " | INHALATION_SPRAY | CUTANEOUS | Status: DC | PRN
  Administered 2020-08-07: 1 via TOPICAL
  Filled 2020-08-06 (×2): qty 56

## 2020-08-06 MED ORDER — SIMETHICONE 80 MG PO CHEW: 80.0000 mg | CHEWABLE_TABLET | ORAL | Status: DC | PRN

## 2020-08-06 MED ORDER — IBUPROFEN 600 MG PO TABS
600.0000 mg | ORAL_TABLET | Freq: Four times a day (QID) | ORAL | Status: DC
Start: 2020-08-06 — End: 2020-08-07
  Administered 2020-08-06 – 2020-08-07 (×4): 600 mg via ORAL
  Filled 2020-08-06 (×5): qty 1

## 2020-08-06 MED ORDER — DIPHENHYDRAMINE HCL 25 MG PO CAPS: 25.0000 mg | ORAL_CAPSULE | Freq: Four times a day (QID) | ORAL | Status: DC | PRN

## 2020-08-06 MED ORDER — ZOLPIDEM TARTRATE 5 MG PO TABS: 5.0000 mg | ORAL_TABLET | Freq: Every evening | ORAL | Status: DC | PRN

## 2020-08-06 MED ORDER — MEASLES, MUMPS & RUBELLA VAC IJ SOLR
0.5000 mL | Freq: Once | INTRAMUSCULAR | Status: DC
  Filled 2020-08-06: qty 0.5

## 2020-08-06 MED ORDER — METHYLERGONOVINE MALEATE 0.2 MG/ML IJ SOLN: 0.2000 mg | Freq: Once | INTRAMUSCULAR | Status: AC

## 2020-08-06 MED ORDER — ONDANSETRON HCL 4 MG/2ML IJ SOLN: 4.0000 mg | INTRAMUSCULAR | Status: DC | PRN

## 2020-08-06 MED ORDER — PRENATAL MULTIVITAMIN CH
1.0000 | ORAL_TABLET | Freq: Every day | ORAL | Status: DC
  Administered 2020-08-06 – 2020-08-07 (×2): 1 via ORAL
  Filled 2020-08-06 (×2): qty 1

## 2020-08-06 MED ORDER — DIBUCAINE (PERIANAL) 1 % EX OINT: 1.0000 "application " | TOPICAL_OINTMENT | CUTANEOUS | Status: DC | PRN

## 2020-08-06 MED ORDER — MEDROXYPROGESTERONE ACETATE 150 MG/ML IM SUSP
150.0000 mg | INTRAMUSCULAR | Status: DC | PRN
  Filled 2020-08-06: qty 1

## 2020-08-06 MED ORDER — DIPHENHYDRAMINE HCL 25 MG PO CAPS
25.0000 mg | ORAL_CAPSULE | Freq: Once | ORAL | Status: AC
Start: 2020-08-06 — End: 2020-08-06
  Administered 2020-08-06: 12:00:00 25 mg via ORAL
  Filled 2020-08-06: qty 1

## 2020-08-06 MED ORDER — AMMONIA AROMATIC IN INHA
RESPIRATORY_TRACT | Status: AC
  Administered 2020-08-06: 01:00:00 10
  Filled 2020-08-06: qty 10

## 2020-08-06 MED ORDER — ACETAMINOPHEN 325 MG PO TABS
650.0000 mg | ORAL_TABLET | ORAL | Status: DC | PRN
  Administered 2020-08-06 – 2020-08-07 (×2): 650 mg via ORAL
  Filled 2020-08-06 (×3): qty 2

## 2020-08-06 MED ORDER — SODIUM CHLORIDE 0.9% IV SOLUTION
Freq: Once | INTRAVENOUS | Status: AC
Start: 2020-08-06 — End: 2020-08-06

## 2020-08-06 MED ORDER — DIPHENOXYLATE-ATROPINE 2.5-0.025 MG PO TABS
2.0000 | ORAL_TABLET | Freq: Four times a day (QID) | ORAL | Status: DC
  Administered 2020-08-06: 04:00:00 2 via ORAL
  Filled 2020-08-06: qty 2

## 2020-08-06 MED ORDER — ONDANSETRON HCL 4 MG PO TABS
4.0000 mg | ORAL_TABLET | ORAL | Status: DC | PRN
  Filled 2020-08-06: qty 1

## 2020-08-06 MED ORDER — FERROUS SULFATE 325 (65 FE) MG PO TABS
325.0000 mg | ORAL_TABLET | Freq: Two times a day (BID) | ORAL | Status: DC
  Administered 2020-08-06 – 2020-08-07 (×2): 325 mg via ORAL
  Filled 2020-08-06 (×2): qty 1

## 2020-08-06 MED ORDER — AMMONIA AROMATIC IN INHA
RESPIRATORY_TRACT | Status: AC
  Filled 2020-08-06: qty 10

## 2020-08-06 MED ORDER — KETOROLAC TROMETHAMINE 30 MG/ML IJ SOLN
30.0000 mg | Freq: Once | INTRAMUSCULAR | Status: AC
  Administered 2020-08-06: 01:00:00 30 mg via INTRAVENOUS

## 2020-08-06 NOTE — Progress Notes (Signed)
RN called provider to the bedside to assess fundus. Upon fundal massage,  McVey, CNM was able to expel large clots from the patients vagina. Provider noted that patient needed to void. RN to weigh blood loss ( ) and notify McVey CNM. Pt requests to go to the bathroom, so RN asked for assistance from shift coordinator getting her up to the bathroom. Pt voided then proceeded to pass out on the toilet. Ammonia opened at this time. Pt came to, but was very sweaty and pale. Pt passed out twice more on the toilet while getting her cleaned up and wasn't as responsive to the ammonia. RNs were able to pivot patient to a wheelchair and get her back in the bed. Pt was hypotensive but more responsive now. McVey, CNM back at the bedside to assess bleeding. Verbal orders (See mar) for more hemorrhage and pain meds to be given at this time. 2nd IV access was obtained at this point with fluid bolus running. RN remained at the bedside, assessing the patient's fundus and vital signs for the next hour. Pt VSS. Her fundus became more firm and bleeding slowed down. CBC to be drawn at 5am. Pt will remain in labor and delivery for continued monitoring.

## 2020-08-06 NOTE — Progress Notes (Signed)
[facility] called to get [facility] records. Will fax ASAP

## 2020-08-06 NOTE — Discharge Summary (Signed)
Obstetrical Discharge Summary  Patient Name: Sheryl Stanley DOB: [birth year] MRN: [id number]  Date of Admission: 08/05/2020 Date of Delivery: 08/05/20 Delivered by: Heloise Ochoa CNM Date of Discharge: 08/07/2020  Primary OB: [facility] VEH:[id number] last menstrual period was 11/07/2019 (approximate). EDC Estimated Date of Delivery: 08/13/20 Gestational Age at Delivery: [redacted]w[redacted]d   Antepartum complications:  1. NO Prenatal records on file- care received at [facility].  2. Prior hx Pre-E with 2016 and 2020 babies 3. Grand multiparity 4. Spanish speaking only.  5. Oligohydramnios, last AFI 6cm per [facility] ultrasound record on 07/17/20 6. Hx preterm birth at 65wks with G1  Admitting Diagnosis: Active Labor, PROM >24hrs with onset of labor.  Secondary Diagnosis:SVD, PP hemorrhage   Patient Active Problem List   Diagnosis Date Noted  . NSVD (normal spontaneous vaginal delivery) 08/07/2020  . Postpartum hemorrhage 08/07/2020    Augmentation: none Complications: None Intrapartum complications/course: precipitous labor and delivery after admission, brisk vaginal bleeding immediately PP- Pitocin IV, cytotec PR given. Delayed hemorrhage approx 1hr post delivery, given hemabate and methergine, additional pitocin and stabilized. additional blood loss for total QBL Date of Delivery: 08/05/20 Delivered By: Heloise Ochoa CNM Delivery Type: spontaneous vaginal delivery Anesthesia: none Placenta: spontaneous Laceration: none Episiotomy: none Newborn Data: Live born female Birth Weight: 7#7 APGAR: 60, 9  Newborn Delivery   Birth date/time: 08/05/2020 23:44:00 Delivery type: Vaginal, Spontaneous     Postpartum Procedures:  -08/06/20 infused 2 units pRBC's for acute blood loss anemia   Edinburgh: No flowsheet data found.    Post partum course:  Patient had a postpartum course complicated by delayed postpartum hemorrhage.   QBL was .  She had symptomatic anemia and H&H changed from 9.2/30.2->6.9/22.6.  She was given 2 units of pRBC's.  H&H after transfusions were 9.0/27.6.  Her symptoms improved after the blood transfusion.  By time of discharge on PPD#2, her pain was controlled on oral pain medications; she had appropriate lochia and was ambulating, voiding without difficulty and tolerating regular diet.  She was deemed stable for discharge to home.    Discharge Physical Exam:  BP 118/66   Pulse (!) 52   Temp 98.2 F (36.8 C) (Oral)   Resp 18   Ht 5\' 2"  (1.575 m)   Wt 97.5 kg   LMP 11/07/2019 (Approximate)   SpO2 100%   Breastfeeding Unknown   BMI 39.32 kg/m   General: NAD CV: RRR Pulm: CTABL, nl effort ABD: s/nd/nt, fundus firm and below the umbilicus Lochia: moderate Perineum: intact, min swelling DVT Evaluation: LE non-ttp, no evidence of DVT on exam.  Hemoglobin  Date Value Ref Range Status  08/07/2020 9.0 (L) 12.0 - 15.0 g/dL Final   HGB  Date Value Ref Range Status  11/18/2014 12.3 12.0 - 16.0 g/dL Final   HCT  Date Value Ref Range Status  08/07/2020 27.6 (L) 36.0 - 46.0 % Final  11/18/2014 38.7 35.0 - 47.0 % Final     Disposition: stable, discharge to home. Baby Feeding: breastmilk Baby Disposition: home with mom  Rh Immune globulin given: n/a Rubella vaccine given: Immune Varicella vaccine given: Immune Tdap vaccine given in AP or PP setting:  Flu vaccine given in AP or PP setting:   Contraception: TBD  Prenatal Labs:   Blood type/Rh B Pos  Antibody screen Neg  Rubella Immune  Varicella Immune  RPR NR  HBsAg NR  HIV NR  GC Neg  Chlamydia Neg  Genetic screening  1 hour GTT   3 hour GTT   GBS Neg      Plan:  Sheryl Stanley was discharged to home in good condition. Follow-up appointment with delivering provider in 6 weeks.  Discharge Medications: Allergies as of 08/07/2020   No Known Allergies     Medication List    STOP  taking these medications   cyclobenzaprine 5 MG tablet Commonly known as: FLEXERIL   HYDROcodone-acetaminophen 5-325 MG tablet Commonly known as: NORCO/VICODIN   meloxicam 15 MG tablet Commonly known as: MOBIC   norethindrone 0.35 MG tablet Commonly known as: Ortho Micronor     TAKE these medications   ibuprofen 600 MG tablet Commonly known as: ADVIL Take 1 tablet (600 mg total) by mouth every 6 (six) hours as needed.   multivitamin-prenatal 27-0.8 MG Tabs tablet Take 1 tablet by mouth daily at 12 noon.        Follow-up Information    Center, [facility]. Schedule an appointment as soon as possible for a visit in 6 week(s).   Contact information: [address] [place] [address] [phone]        [facility]. Call.   Why: with any concerns or questions Contact information: [address] [place] [address] [phone]              Signed:  Gregery Na 08/07/2020  9:33 AM

## 2020-08-06 NOTE — Progress Notes (Signed)
Post Partum Day 1  Subjective: Resting in bed, reports pain in abdomen  Unable to ambulate without vertigo, feels weak, pain managed with PO meds, tolerating regular diet, and voiding without difficulty.   No fever/chills, chest pain, shortness of breath, nausea/vomiting, or leg pain. No nipple or breast pain. No headache, visual changes, or RUQ/epigastric pain.  Objective: BP 120/67   Pulse 75   Temp 98.6 F (37 C) (Oral)   Resp 16   Ht 5\' 2"  (1.575 m)   Wt 97.5 kg   LMP 11/07/2019 (Approximate)   SpO2 99%   Breastfeeding Unknown   BMI 39.32 kg/m    Physical Exam:  General: alert, cooperative and pale Breasts: soft/nontender CV: RRR Pulm: nl effort, CTABL Abdomen: soft, non-tender, active bowel sounds Uterine Fundus: firm Perineum: minimal edema, intact Lochia: appropriate DVT Evaluation: No evidence of DVT seen on physical exam.  Recent Labs    08/05/20 2317 08/06/20 0553  HGB 9.2* 6.9*  HCT 30.7* 22.6*  WBC 6.5 14.8*  PLT 225 195    Assessment/Plan: 29 y.o. 29 postpartum day # 1  -Continue routine postpartum care.  Plan to transfer to M/B after blood transfusion initiated.  -Lactation consult PRN for breastfeeding  -Acute blood loss anemia - hemodynamically stable but symptomatic.  Reports feeling fatigue and dizziness -Risks/benefits of blood transfusion discussed via interpreter.  Sheryl Stanley consents to blood transfusion.   -Check H&H 2 hours after 2nd transfusion -Plan to check CBC and CMP in AM -Plan discussed with Dr. [id number].  Agrees with proceeding with blood transfusion.    Disposition: Continue inpatient postpartum care   LOS: 1 day   Feliberto Gottron, Gustavo Lah 08/06/2020, 8:28 AM   ----- 08/08/2020  Certified Nurse Midwife [facility]

## 2020-08-07 LAB — BPAM RBC
Blood Product Expiration Date: 202201072359
Blood Product Expiration Date: 202201092359
ISSUE DATE / TIME: 202112140903
ISSUE DATE / TIME: 202112141134
Unit Type and Rh: 7300
Unit Type and Rh: 7300

## 2020-08-07 LAB — TYPE AND SCREEN
ABO/RH(D): B POS
Antibody Screen: NEGATIVE
Unit division: 0
Unit division: 0

## 2020-08-07 LAB — CBC
HCT: 27.6 % — ABNORMAL LOW (ref 36.0–46.0)
Hemoglobin: 9 g/dL — ABNORMAL LOW (ref 12.0–15.0)
MCH: 24.3 pg — ABNORMAL LOW (ref 26.0–34.0)
MCHC: 32.6 g/dL (ref 30.0–36.0)
MCV: 74.4 fL — ABNORMAL LOW (ref 80.0–100.0)
Platelets: 206 10*3/uL (ref 150–400)
RBC: 3.71 MIL/uL — ABNORMAL LOW (ref 3.87–5.11)
RDW: 18.7 % — ABNORMAL HIGH (ref 11.5–15.5)
WBC: 9.2 10*3/uL (ref 4.0–10.5)
nRBC: 0 % (ref 0.0–0.2)

## 2020-08-07 LAB — COMPREHENSIVE METABOLIC PANEL
ALT: 16 U/L (ref 0–44)
AST: 34 U/L (ref 15–41)
Albumin: 2.4 g/dL — ABNORMAL LOW (ref 3.5–5.0)
Alkaline Phosphatase: 150 U/L — ABNORMAL HIGH (ref 38–126)
Anion gap: 7 (ref 5–15)
BUN: 8 mg/dL (ref 6–20)
CO2: 20 mmol/L — ABNORMAL LOW (ref 22–32)
Calcium: 8.6 mg/dL — ABNORMAL LOW (ref 8.9–10.3)
Chloride: 110 mmol/L (ref 98–111)
Creatinine, Ser: 0.57 mg/dL (ref 0.44–1.00)
GFR, Estimated: >60 mL/min (ref >60)
Glucose, Bld: 82 mg/dL (ref 70–99)
Potassium: 4 mmol/L (ref 3.5–5.1)
Sodium: 137 mmol/L (ref 135–145)
Total Bilirubin: 0.5 mg/dL (ref 0.3–1.2)
Total Protein: 5.7 g/dL — ABNORMAL LOW (ref 6.5–8.1)

## 2020-08-07 LAB — VARICELLA ZOSTER ANTIBODY, IGG: Varicella IgG: 266 index (ref >165)

## 2020-08-07 LAB — RUBELLA SCREEN: Rubella: 1.29 index (ref >0.99)

## 2020-08-07 MED ORDER — IBUPROFEN 600 MG PO TABS
600.0000 mg | ORAL_TABLET | Freq: Four times a day (QID) | ORAL | Status: DC
  Administered 2020-08-07: 14:00:00 600 mg via ORAL
  Filled 2020-08-07 (×2): qty 1

## 2020-08-07 NOTE — Progress Notes (Signed)
Patient discharged home with infant. Interpreter present during teaching. Discharge instructions and prescriptions given and reviewed with patient. Patient verbalized understanding.   Scheduled follow-up appointment for patient at [facility].  Escorted out by volunteers.

## 2020-08-07 NOTE — Discharge Instructions (Signed)
Discharge Instructions:   Follow-up Appointment: Wednesday, 09/18/20 at 11:20am at [facility]!   If there are any new medications, they have been ordered and will be available for pickup at the listed pharmacy on your way home from the hospital.   Call office if you have any of the following: headache, visual changes, fever >101.0 F, chills, shortness of breath, breast concerns, excessive vaginal bleeding, incision drainage or problems, leg pain or redness, depression or any other concerns. If you have vaginal discharge with an odor, let your doctor know.   It is normal to bleed for up to 6 weeks. You should not soak through more than 1 pad in 1 hour. If you have a blood clot larger than your fist with continued bleeding, call your doctor.   Activity: Do not lift > 10 lbs for 6 weeks (do not lift anything heavier than your baby). No intercourse, tampons, swimming pools, hot tubs, baths (only showers) for 6 weeks.  No driving for 1-2 weeks. Continue prenatal vitamin, especially if breastfeeding. Increase calories and fluids (water) while breastfeeding.   Your milk will come in, in the next couple of days (right now it is colostrum). You may have a slight fever when your milk comes in, but it should go away on its own.  If it does not, and rises above 101 F please call the doctor. You will also feel achy and your breasts will be firm. They will also start to leak. If you are breastfeeding, continue as you have been and you can pump/express milk for comfort.   If you have too much milk, your breasts can become engorged, which could lead to mastitis. This is an infection of the milk ducts. It can be very painful and you will need to notify your doctor to obtain a prescription for antibiotics. You can also treat it with a shower or hot/cold compress.   For concerns about your baby, please call your pediatrician.  For breastfeeding concerns, the lactation consultant can be  reached at [phone].   Postpartum blues (feelings of happy one minute and sad another minute) are normal for the first few weeks but if it gets worse let your doctor know.   Congratulations! We enjoyed caring for you and your new bundle of joy!   Parto vaginal, cuidados de puerperio Postpartum Care After Vaginal Delivery Lea esta informacin sobre cmo cuidarse desde el momento en que nazca su beb y Elaina Hoops 6 a 12 semanas despus del parto (perodo del posparto). El mdico tambin podr darle instrucciones ms especficas. Comunquese con su mdico si tiene problemas o preguntas. Siga estas indicaciones en su casa: Hemorragia vaginal  Es normal tener un poco de hemorragia vaginal (loquios) despus del parto. Use un apsito sanitario para el sangrado vaginal y secrecin. ? Durante la primera semana despus del parto, la cantidad y el aspecto de los loquios a menudo es similar a las del perodo menstrual. ? Durante las siguientes semanas disminuir gradualmente hasta convertirse en una secrecin seca amarronada o [place]. ? En la Lennar Corporation, los loquios se detienen Guardian Life Insurance 4 a 6semanas despus del [place]. Los sangrados vaginales pueden variar de mujer a Nurse, learning disability.  Cambie los apsitos sanitarios con frecuencia. Observe si hay cambios en el flujo, como: ? Un aumento repentino en el volumen. ? Cambio en el color. ? Cogulos sanguneos grandes.  Si expulsa un cogulo de sangre por la vagina, gurdelo y llame al mdico para informrselo. No deseche los  cogulos de sangre por el inodoro antes de hablar con su mdico.  No use tampones ni se haga duchas vaginales hasta que el mdico la autorice.  Si no est amamantando, volver a tener su perodo entre 6 y 8 semanas despus del parto. Si solamente alimenta al beb con Alexis Goodell materna (lactancia materna exclusiva), podra no volver a tener su perodo hasta que deje de [place]. Cuidados perineales  Mantenga la zona entre  la vagina y el ano (perineo) limpia y [place], [place] se lo haya indicado el mdico. Utilice apsitos o aerosoles analgsicos y Control and instrumentation engineer, como se lo hayan indicado.  Si le hicieron un corte en el perineo (episiotoma) o tuvo un desgarro en la vagina, controle la zona para detectar signos de infeccin hasta que sane. Est atenta a los siguientes signos: ? Aumento del enrojecimiento, la hinchazn o Chief Technology Officer. ? Presenta lquido o sangre que supura del corte o Insurance account manager. ? Calor. ? Pus o mal olor.  Es posible que le den una botella rociadora para que use en lugar de limpiarse el rea con papel higinico despus de usar el bao. Cuando comience a Barrister's clerk, podr usar la botella rociadora antes de secarse. Asegrese de secarse suavemente.  Para aliviar el dolor causado por una episiotoma, un desgarro en la vagina o venas hinchadas en el ano (hemorroides), trate de tomar un bao de asiento tibio 2 o 3 veces por da. Un bao de asiento es un bao de agua tibia que se toma mientras se est sentado. El agua solo debe Adult nurse las caderas y cubrir las nalgas. Cuidado de las [address]  En los [address] despus del parto, las mamas pueden sentirse pesadas, llenas e incmodas (congestin [place]). Tambin puede escaparse leche de sus senos. El mdico puede sugerirle mtodos para Emergency planning/management officer. La congestin mamaria debera desaparecer al cabo de [employer] Mutual of Omaha.  Si est amamantando: ? Use un sostn que sujete y ajuste bien sus pechos. ? Mantenga los pezones secos y limpios. Aplquese cremas y ungentos, como se lo haya indicado el mdico. ? Es posible que deba usar discos de algodn en el sostn para Environmental health practitioner la [place] que se filtre de sus senos. ? Puede tener contracciones uterinas cada vez que amamante durante varias semanas despus del [place]. Las contracciones uterinas ayudan al tero a Hotel manager a su tamao habitual. ? Si tiene algn problema con la lactancia materna, colabore con el mdico o un Arts administrator.  Si no est amamantando: ? Evite tocarse Xcel Energy. Al hacerlo, podran producir ms leche. ? Use un sostn que le proporcione el ajuste correcto y compresas fras para reducir la hinchazn. ? No extraiga (saque) Colgate Palmolive. Esto har que produzca ms WPS Resources. Intimidad y sexualidad  Pregntele al mdico cundo puede retomar la actividad sexual. Esto puede depender de lo siguiente: ? Su riesgo de sufrir infecciones. ? La rapidez con la que est sanando. ? Su comodidad y deseo de retomar la actividad sexual.  Despus del parto, puede quedar embarazada incluso si no ha tenido todava su perodo. Si lo desea, hable con el mdico acerca de los mtodos de control de la natalidad (mtodos anticonceptivos). Medicamentos  Baxter International de venta libre y los recetados solamente como se lo haya indicado el mdico.  Si le recetaron un antibitico, tmelo como se lo haya indicado el mdico. No deje de tomar el antibitico aunque comience a sentirse mejor. Actividad  Retome sus actividades normales de a Naval architect se lo haya indicado  el mdico. Pregntele al mdico qu actividades son seguras para usted.  Descanse todo lo que pueda. Trate de descansar o tomar una siesta mientras el beb duerme. Comida y bebida   Beba suficiente lquido como para Pharmacologist la orina de color amarillo plido.  Coma alimentos ricos en Enbridge Energy. Estos pueden ayudarla a prevenir o Educational psychologist. Los alimentos ricos en fibras incluyen, entre otros: ? Panes y cereales integrales. ? Arroz integral. ? Armed forces operational officer. ? Nils Pyle y verduras frescas.  No intente perder de peso rpidamente reduciendo el consumo de caloras.  Tome sus vitaminas prenatales hasta la visita de seguimiento de posparto o hasta que su mdico le indique que puede dejar de tomarlas. Estilo de vida  No consuma ningn producto que contenga nicotina o tabaco, como cigarrillos y Administrator, Civil Service. Si  necesita ayuda para dejar de fumar, consulte al mdico.  No beba alcohol, especialmente si est amamantando. Instrucciones generales  Concurra a todas las visitas de seguimiento para usted y el beb, como se lo haya indicado el mdico. La mayora de las mujeres visita al mdico para un seguimiento de posparto dentro de las primeras 3 a 6 semanas despus del parto. Comunquese con un mdico si:  Se siente incapaz de controlar los cambios que implica tener un hijo y esos sentimientos no desaparecen.  Siente tristeza o preocupacin de forma inusual.  Las mamas se ponen rojas, le duelen o se endurecen.  Tiene fiebre.  Tiene dificultad para retener la [place] o para impedir que la orina se escape.  Tiene poco inters o falta de inters en actividades que solan gustarle.  No ha amamantado nada y no ha tenido un perodo menstrual durante 12 semanas despus del [place].  Dej de amamantar al beb y no ha tenido su perodo menstrual durante 12 semanas despus de dejar de Museum/gallery exhibitions officer.  Tiene preguntas sobre su cuidado y el del beb.  Elimina un cogulo de sangre grande por la vagina. Solicite ayuda de inmediato si:  Midwife.  Tiene dificultad para respirar.  Tiene un dolor repentino e intenso en la pierna.  Tiene dolor intenso o clicos en el la parte inferior del abdomen.  Tiene una hemorragia tan intensa de la vagina que empapa ms de un apsito en [employer]. El sangrado no debe ser ms abundante que el perodo ms intenso que haya tenido.  Dolor de cabeza intenso.  Se desmaya.  Tiene visin borrosa o Nurse, adult.  Tiene secrecin vaginal con mal olor.  Tiene pensamientos acerca de lastimarse a usted misma o a su beb. Si alguna vez siente que puede lastimarse a usted misma o a Economist, o tiene pensamientos de poner fin a su vida, busque ayuda de inmediato. Puede dirigirse al departamento de emergencias ms cercano o llamar a:  El servicio de  emergencias de su localidad (911 en [place].).  Una lnea de asistencia al suicida y Visual merchandiser en crisis, como la Murphy Oil de Prevencin del Suicidio (National Suicide Prevention Lifeline), al [phone]. Est disponible las 24 horas del da. Resumen  El perodo de tiempo justo despus el parto y Elaina Hoops 6 a 12 semanas despus del parto se denomina perodo posparto.  Retome sus actividades normales de a Naval architect se lo haya indicado el mdico.  Concurra a todas las visitas de seguimiento para usted y el beb, como se lo haya indicado el mdico. Esta informacin no tiene Theme park manager el consejo del mdico. Asegrese de hacerle  al mdico cualquier pregunta que tenga. Document Revised: 11/20/2017 Document Reviewed: 08/01/2017 Elsevier Patient Education  2020 Elsevier Inc.   [place] materna Breastfeeding  Decidir amamantar es una de las mejores elecciones que puede hacer por usted y su beb. Un cambio en las hormonas durante el embarazo hace que las mamas produzcan leche materna en las glndulas productoras de [place]. Las hormonas impiden que la leche materna sea liberada antes del nacimiento del beb. Adems, impulsan el flujo de leche luego del nacimiento. Una vez que ha comenzado a Museum/gallery exhibitions officer, Conservation officer, nature beb, as Immunologist succin o Theatre manager, pueden estimular la liberacin de [place] de las glndulas productoras de [place]. Los beneficios de Smith International investigaciones demuestran que la lactancia materna ofrece muchos beneficios de salud para bebs y [place]. Adems, ofrece una forma gratuita y conveniente de Corporate treasurer al beb. Para el beb  La primera leche (calostro) ayuda a Careers information officer funcionamiento del aparato digestivo del beb.  Las clulas especiales de la leche (anticuerpos) ayudan a Artist las infecciones en el beb.  Los bebs que se alimentan con leche materna tambin tienen menos probabilidades de tener asma, alergias, obesidad o diabetes de tipo 2. Adems, tienen  menor riesgo de sufrir el sndrome de muerte sbita del lactante (SMSL).  Los nutrientes de la [place] materna son mejores para Patent examiner las necesidades del beb en comparacin con la CHS Inc.  La leche materna mejora el desarrollo cerebral del beb. Para usted  La lactancia materna favorece el desarrollo de un vnculo muy especial entre la madre y el beb.  Es conveniente. La leche materna es econmica y siempre est disponible a la Human resources officer.  La lactancia materna ayuda a quemar caloras. Claude Manges a perder el peso ganado durante el [place].  Hace que el tero vuelva al tamao que tena antes del embarazo ms rpido. Adems, disminuye el sangrado (loquios) despus del parto.  La lactancia materna contribuye a reducir Nurse, adult de tener diabetes de tipo 2, osteoporosis, artritis reumatoide, enfermedades cardiovasculares y cncer de mama, ovario, tero y endometrio en el futuro. Informacin bsica sobre la lactancia Comienzo de la lactancia  Encuentre un lugar cmodo para sentarse o Teacher, music, con un buen respaldo para el cuello y la espalda.  Coloque una almohada o una manta enrollada debajo del beb para acomodarlo a la altura de la mama (si est sentada). Las almohadas para Museum/gallery exhibitions officer se han diseado especialmente a fin de servir de apoyo para los brazos y el beb Smithfield Foods.  Asegrese de que la barriga del beb (abdomen) est frente a la suya.  [place] suavemente la mama. Con las yemas de los dedos, Liberty Media bordes exteriores de la mama hacia adentro, en direccin al pezn. Esto estimula el flujo de [place]. Si la [employer]ot, es posible que deba Educational psychologist con este movimiento durante la Market researcher.  Sostenga la mama con 4 dedos por debajo y Multimedia programmer por arriba del pezn (forme la letra "C" con la mano). Asegrese de que los dedos se encuentren lejos del pezn y de la boca del beb.  Empuje suavemente los labios del beb con el pezn o con el  dedo.  Cuando la boca del beb se abra lo suficiente, acrquelo rpidamente a la mama e introduzca todo el pezn y la arola, tanto como sea posible, dentro de la boca del beb. La arola es la zona de color que rodea al pezn. ? Debe haber ms arola visible por arriba del labio superior del beb  que por debajo del labio inferior. ? Los labios del beb deben estar abiertos y extendidos hacia afuera (evertidos) para asegurar que el beb se prenda de forma adecuada y cmoda. ? La lengua del beb debe estar entre la enca inferior y Educational psychologistla mama.  Asegrese de que la boca del beb est en la posicin correcta alrededor del pezn (prendido). Los labios del beb deben crear un sello sobre la mama y estar doblados hacia afuera (invertidos).  Es comn que el beb succione durante 2 a 3 minutos para que comience el flujo de [place] materna. Cmo debe prenderse Es muy importante que le ensee al beb cmo prenderse adecuadamente a la mama. Si el beb no se prende adecuadamente, puede causar Federated Department Storesdolor en los pezones, reducir la produccin de Hoffman Estatesleche materna y Radio producerhacer que el beb tenga un escaso aumento de Gascoynepeso. Adems, si el beb no se prende adecuadamente al pezn, puede tragar aire durante la alimentacin. Esto puede causarle molestias al beb. Hacer eructar al beb al Pilar Platecambiar de mama puede ayudarlo a liberar el aire. Sin embargo, ensearle al beb cmo prenderse a la mama adecuadamente es la mejor manera de evitar que se sienta molesto por tragar Oceanographeraire mientras se alimenta. Signos de que el beb se ha prendido adecuadamente al pezn  Tironea o succiona de modo silencioso, sin Publishing rights managercausarle dolor. Los labios del beb deben estar extendidos hacia afuera (evertidos).  Se escucha que traga cada 3 o 4 succiones una vez que la WPS Resourcesleche ha comenzado a Radiographer, therapeuticfluir (despus de que se produzca el reflejo de eyeccin de la Trippleche).  Hay movimientos musculares por arriba y por delante de sus odos al Printmakersuccionar. Signos de que el beb no se ha  prendido Audiological scientistadecuadamente al pezn  Hace ruidos de succin o de chasquido mientras se Tree surgeonalimenta.  Siente dolor en los pezones. Si cree que el beb no se prendi correctamente, deslice el dedo en la comisura de la boca y Ameren Corporationcolquelo entre las encas del beb para interrumpir la succin. Intente volver a comenzar a Museum/gallery exhibitions officeramamantar. Signos de Market researcherlactancia materna exitosa Signos del beb  El beb disminuir gradualmente el nmero de succiones o dejar de succionar por completo.  El beb se quedar dormido.  El cuerpo del beb se relajar.  El beb retendr Neomia Dearuna pequea cantidad de Kindred Healthcareleche en la boca.  El beb se desprender solo del [place] Colon[place]cho. Signos que presenta usted  Las mamas han aumentado la firmeza, el peso y el tamao 1 a 3 horas despus de Museum/gallery exhibitions officeramamantar.  Estn ms blandas inmediatamente despus de amamantar.  Se producen un aumento del volumen de Azerbaijanleche y un cambio en su consistencia y color hacia el quinto da de Market researcherlactancia.  Los pezones no duelen, no estn agrietados ni sangran. Signos de que su beb recibe la cantidad de leche suficiente  Mojar por lo menos 1 o 2paales durante las primeras 24horas despus del nacimiento.  Mojar por lo menos 5 o 6paales cada 24horas durante la primera semana despus del nacimiento. La orina debe ser clara o de color amarillo plido a los 5das de vida.  Mojar entre 6 y 8paales cada 24horas a medida que el beb sigue creciendo y desarrollndose.  Defeca por lo menos 3 veces en 24 horas a los 5 [address]  [address] de [address]. Las heces deben ser blandas y Armed forces operational officeramarillentas.  Defeca por lo menos 3 veces en 24 horas a los [address] das de [address]. Las heces deben ser grumosas y Armed forces operational officeramarillentas.  No registra una prdida de Imbodenpeso mayor al  10% del peso al ConocoPhillips primeros 3 das de [place].  Aumenta de peso un promedio de 4 a 7onzas (113 a 198g) por semana despus de los 4 [address] de vida.  Aumenta de [place], [place], de Maria Stein uniforme a Glass blower/designer de los 5 [address] de vida, sin Passenger transport manager  prdida de peso despus de las 2semanas de vida. Despus de alimentarse, es posible que el beb regurgite una pequea cantidad de [place]. Esto es normal. Frecuencia y duracin de la lactancia El amamantamiento frecuente la ayudar a producir ms [place] y puede prevenir dolores en los pezones y las mamas extremadamente llenas (congestin [place]). Alimente al beb cuando muestre signos de hambre o si siente la necesidad de reducir la congestin de las [place]. Esto se denomina "lactancia a demanda". Las seales de que el beb tiene hambre incluyen las siguientes:  Aumento del [place] de [place], [place] o inquietud.  Mueve la cabeza de un lado a otro.  Abre la boca cuando se le toca la mejilla o la comisura de la boca (reflejo de bsqueda).  Aumenta las vocalizaciones, tales como sonidos de succin, se relame los labios, emite arrullos, suspiros o chirridos.  Mueve la [employer] Apparel Group boca y se chupa los dedos o las manos.  Est molesto o llora. Evite el uso del chupete en las primeras 4 a 6 semanas despus del nacimiento del beb. Despus de este perodo, podr usar un chupete. Las investigaciones demostraron que el uso del chupete durante Financial risk analyst ao de vida del beb disminuye el riesgo de tener el sndrome de muerte sbita del lactante (SMSL). Permita que el nio se alimente en cada mama todo lo que desee. Cuando el beb se desprende o se queda dormido mientras se est alimentando de la primera mama, ofrzcale la segunda. Debido a que, con frecuencia, los recin nacidos estn somnolientos las primeras semanas de vida, es posible que deba despertar al beb para alimentarlo. Los horarios de Acupuncturist de un beb a otro. Sin embargo, las siguientes reglas pueden servir como gua para ayudarla a Lawyer que el beb se alimenta adecuadamente:  Se puede amamantar a los recin nacidos (bebs de 4 semanas o menos de vida) cada 1 a 3 horas.  No deben transcurrir ms de 3 horas durante el da o 5  horas durante la noche sin que se amamante a los recin nacidos.  Debe amamantar al beb un mnimo de 8 veces en un perodo de 24 horas. Extraccin de American Standard Companies extraccin y Contractor de la leche materna le permiten asegurarse de que el beb se alimente exclusivamente de su leche materna, aun en momentos en los que no puede Museum/gallery exhibitions officer. Esto tiene especial importancia si debe regresar al Aleen Campi en el perodo en que an est amamantando o si no puede estar presente en los momentos en que el beb debe alimentarse. Su asesor en lactancia puede ayudarla a Clinical research associate un mtodo de extraccin que funcione mejor para usted y Programmer, systems cunto tiempo es seguro almacenar [place]. Cmo cuidar las mamas durante la lactancia Los pezones pueden secarse, Lobbyist y doler durante la Market researcher. Las siguientes recomendaciones pueden ayudarla a Pharmacologist las TEPPCO Partners y sanas:  Careers information officer usar jabn en los pezones.  Use un sostn de soporte diseado especialmente para la lactancia materna. Evite usar sostenes con aro o sostenes muy ajustados (sostenes deportivos).  Seque al aire sus pezones durante 3 a despus de amamantar al beb.  Utilice solo apsitos  de algodn en el sostn para absorber las prdidas de [place]. La prdida de un poco de Public Service Enterprise Group tomas es normal.  Utilice lanolina sobre los pezones luego de Museum/gallery exhibitions officer. La lanolina ayuda a mantener la humedad normal de la piel. La lanolina pura no es perjudicial (no es txica) para el beb. Adems, puede extraer Beazer Homes algunas gotas de [place] materna y Engineer, maintenance (IT) suavemente esa ToysRus pezones para que la [place] se seque al aire. Durante las primeras semanas despus del nacimiento, algunas mujeres experimentan [place]. La congestin [employer] [place] Corporation puede hacer que sienta las mamas pesadas, calientes y sensibles al tacto. El pico de la congestin mamaria ocurre en el plazo de los 3 a 5 das despus  del [place]. Las siguientes recomendaciones pueden ayudarla a Paramedic la congestin mamaria:  Vace por completo las mamas al QUALCOMM o Environmental health practitioner. Puede aplicar calor hmedo en las mamas (en la ducha o con toallas hmedas para manos) antes de Museum/gallery exhibitions officer o extraer WPS Resources. Esto aumenta la circulacin y [place] a que la [place]. Si el beb no vaca por completo las [address] cuando lo [address], extraiga la [place] restante despus de que haya finalizado.  Aplique compresas de hielo Yahoo! Inc inmediatamente despus de Museum/gallery exhibitions officer o extraer [place], a menos que le resulte demasiado incmodo. Haga lo siguiente: ? Ponga el hielo en una bolsa plstica. ? Coloque una FirstEnergy Corp piel y la bolsa de hielo. ? Coloque el hielo durante , 2 o 3veces por da.  Asegrese de que el beb est prendido y se encuentre en la posicin correcta mientras lo alimenta. Si la congestin mamaria persiste luego de 48 horas o despus de seguir estas recomendaciones, comunquese con su mdico o un Holiday representative. Recomendaciones de salud general durante la lactancia  Consuma 3 comidas y 3 colaciones saludables todos los [place]. Las M.D.C. Holdings bien alimentadas que amamantan necesitan entre 450 y 500 caloras adicionales por Futures trader. Puede cumplir con este requisito al aumentar la cantidad de una dieta equilibrada que realice.  Beba suficiente agua para mantener la orina clara o de color amarillo plido.  Descanse con frecuencia, reljese y siga tomando sus vitaminas prenatales para prevenir la fatiga, el estrs y los niveles bajos de vitaminas y [employer] Tim[employer] en el cuerpo (deficiencias de nutrientes).  No consuma ningn producto que contenga nicotina o tabaco, como cigarrillos y Administrator, Civil Service. El beb puede verse afectado por las sustancias qumicas de los cigarrillos que pasan a la [place] materna y por la exposicin al humo ambiental del tabaco. Si necesita ayuda para dejar de fumar, consulte al mdico.  Evite  el consumo de alcohol.  No consuma drogas ilegales o marihuana.  Antes de Dietitian, hable con el mdico. Estos incluyen medicamentos recetados y de [place], como tambin vitaminas y suplementos a base de hierbas. Algunos medicamentos, que pueden ser perjudiciales para el beb, pueden pasar a travs de la [employer].  Puede quedar embarazada durante la lactancia. Si se desea un mtodo anticonceptivo, consulte al mdico sobre cules son las opciones seguras durante la Market researcher. Dnde encontrar ms informacin: Liga internacional [place]: [email or web address]/. Comunquese con un mdico si:  Siente que quiere dejar de Museum/gallery exhibitions officer o se siente frustrada con la lactancia.  Sus pezones estn agrietados o Water quality scientist.  Sus mamas estn irritadas, sensibles o calientes.  Tiene los siguientes sntomas: ? Dolor en las mamas o en los pezones. ? Un rea hinchada en cualquiera de las mamas. ? Grant Ruts o escalofros. ?  Nuseas o vmitos. ? Drenaje de otro lquido distinto de la [employer] materna desde los pezones.  Sus mamas no se llenan antes de Museum/gallery exhibitions officer al beb para el quinto da despus del [place].  Se siente triste y deprimida.  El beb: ? Est demasiado somnoliento como para comer bien. ? Tiene problemas para dormir. ? Tiene ms de 1 semana de vida y [employer] de 6 paales en un periodo de 24 horas. ? No ha aumentado de [place] a los [address] de [address]  El beb defeca menos de 3 veces en 24 horas.  La piel del beb o las partes blancas de los ojos se vuelven amarillentas. Solicite ayuda de inmediato si:  El beb est muy cansado Retail buyer) y no se quiere despertar para comer.  Le sube la fiebre sin causa. Resumen  La lactancia materna ofrece muchos beneficios de salud para bebs y [place].  Intente amamantar a su beb cuando muestre signos tempranos de hambre.  Haga cosquillas o empuje suavemente los labios del beb con el dedo o el pezn para lograr que el beb abra la boca.  Acerque el beb a la mama. Asegrese de que la mayor parte de la arola se encuentre dentro de la boca del beb. Ofrzcale una mama y haga eructar al beb antes de pasar a la otra.  Hable con su mdico o asesor en lactancia si tiene dudas o problemas con la lactancia. Esta informacin no tiene Theme park manager el consejo del mdico. Asegrese de hacerle al mdico cualquier pregunta que tenga. Document Revised: 11/04/2017 Document Reviewed: 11/30/2016 Elsevier Patient Education  2020 [employer].      Depresin posparto Postpartum Baby Blues El perodo del posparto comienza inmediatamente despus del nacimiento de un beb. Este tiempo suele ser una poca de gran felicidad y mucho entusiasmo. Tambin es tiempo de muchos cambios en la vida de [place]. Independientemente de cuntas veces una madre d a luz, cada nio trae nuevos desafos a la familia, incluidas nuevas formas de relacionarse con los dems. Es frecuente tener sentimientos de entusiasmo y, a la vez, cambios desconcertantes en el estado de nimo, las emociones y los pensamientos. Es posible que se sienta feliz un minuto y [place] o estresada el siguiente. Estos sentimientos de tristeza suelen ocurrir inmediatamente despus del nacimiento y Electronics engineer en un lapso de una o [employer]. Esto se llama "depresin posparto". Cules son las causas? La depresin posparto no tiene una causa conocida. Probablemente sea consecuencia de una combinacin de factores. Sin embargo, se cree que los [employer] niveles hormonales ocurridos despus del nacimiento desencadenan algunos de los sntomas. Otros factores que pueden afectar estos cambios anmicos incluyen los siguientes:  Falta de sueo.  Sucesos estresantes de la vida, como la pobreza, el cuidado de un ser querido o la muerte de un ser querido.  Factores genticos. Cules son los signos o los sntomas? Los sntomas de esta afeccin incluyen los siguientes:  Cambios en el  estado de nimo durante lapsos breves, como pasar de la felicidad extrema a la tristeza.  Falta de concentracin.  Dificultad para dormir.  Ataques de llanto y sensibilidad emocional.  Prdida del apetito.  Irritabilidad.  Ansiedad. Si los sntomas de la depresin posparto duran ms de 2semanas o [place], podra tener una forma ms grave de depresin posparto. Cmo se diagnostica? Esta afeccin se diagnostica en funcin de una evaluacin de los sntomas. No hay exmenes mdicos ni pruebas de laboratorio que permitan hacer un diagnstico, pero hay  varios cuestionarios que el mdico podra usar para determinar si una mujer tiene depresin posparto o una forma ms grave de esta. Cmo se trata? No se requiere tratamiento para esta afeccin. Generalmente, la depresin posparto desaparece sin tratamiento en el trmino de 1 o 2semanas. A menudo, todo lo que se necesita es apoyo social. Se le recomendar que duerma y descanse lo suficiente. Siga estas indicaciones en su casa: Estilo de vida      Descanse todo lo posible. Tome una siesta cuando el beb duerma.  Haga actividad fsica habitualmente como se lo haya indicado el mdico. Para algunas mujeres, el yoga y las caminatas son tiles.  Consuma una dieta equilibrada y nutritiva. Esto incluye muchas frutas y verduras, cereales integrales y protenas magras.  Haga pequeas cosas que disfruta. Tome una taza de t, dese un bao de burbujas, lea su revista favorita o escuche su msica predilecta.  Evite el alcohol.  Pida ayuda con las tareas domsticas, la cocina, las compras, o las obligaciones diarias. No intente hacer todo usted misma. Considere la posibilidad de contratar a Neomia Dear doula para que la ayude y Psychiatric nurse. Se trata de una profesional que se especializa en asistir a nuevas madres.  Intente no hacer ningn cambio importante durante el embarazo ni inmediatamente despus del nacimiento. Esto podra sumar estrs. Instrucciones  generales  Hable con personas allegadas sobre cmo se siente. Busque el apoyo de su pareja, sus familiares, sus amigos o de [employer] primerizas. Quiz Remus Blake a un grupo de apoyo.  Encuentre formas de lidiar con Development worker, community. Esto puede incluir lo siguiente: ? Escribir sus pensamientos y sentimientos en un diario. ? Pasar tiempo al [employer]. ? Pasar tiempo con personas que la hagan rer.  Intente pensar positivamente. Piense en aquellas cosas por las que se siente agradecida.  Tome los medicamentos de venta libre y los recetados solamente como se lo haya indicado el mdico.  Infrmele a su mdico sobre cualquier inquietud que tenga.  Concurrir a todas las visitas durante el posparto tal como se lo haya indicado el mdico. Esto es importante. Comunquese con un mdico si:  La depresin posparto no desaparece despus de 2semanas. Solicite ayuda de inmediato si:  Tiene pensamientos sobre [employer] vida (pensamientos suicidas).  Cree que podra lastimar al beb o a Economist.  Ve u oye cosas que no existen (alucinaciones). Resumen  Despus de dar a luz, es posible que se sienta feliz un minuto y [place] o estresada el siguiente. Los sentimientos de tristeza que ocurren inmediatamente despus de que el beb nace y desaparecen luego de una o dos semanas se llaman "depresin posparto".  Puede controlar la depresin posparto descansando lo suficiente, siguiendo una dieta saludable, realizando actividad fsica, pasando tiempo con personas que le den apoyo y encontrando modos de lidiar con Development worker, community.  Si los sentimientos de tristeza o estrs duran ms de 2semanas o perjudican el cuidado que le brinda al beb, hable con el mdico. Esto podra significar que tiene un caso ms grave de depresin posparto. Esta informacin no tiene Theme park manager el consejo del mdico. Asegrese de hacerle al mdico cualquier pregunta que tenga. Document Revised: 11/08/2017 Document  Reviewed: 04/23/2017 Elsevier Patient Education  2020 [employer].

## 2020-08-09 LAB — PREPARE RBC (CROSSMATCH)
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
## 2022-04-03 ENCOUNTER — Emergency Department: Payer: Self-pay

## 2022-04-03 ENCOUNTER — Encounter: Payer: Self-pay | Admitting: Emergency Medicine

## 2022-04-03 ENCOUNTER — Emergency Department
Admission: EM | Admit: 2022-04-03 | Discharge: 2022-04-03 | Disposition: A | Discharge status: Home / Self Care | Payer: Self-pay | Attending: Student in an Organized Health Care Education/Training Program | Admitting: Student in an Organized Health Care Education/Training Program

## 2022-04-03 DIAGNOSIS — M25571 Pain in right ankle and joints of right foot: Secondary | ICD-10-CM | POA: Insufficient documentation

## 2022-04-03 DIAGNOSIS — G8911 Acute pain due to trauma: Secondary | ICD-10-CM | POA: Insufficient documentation

## 2022-04-03 MED ORDER — IBUPROFEN 600 MG PO TABS
600.0000 mg | ORAL_TABLET | Freq: Once | ORAL | Status: AC
  Administered 2022-04-03: 600 mg via ORAL
  Filled 2022-04-03: qty 1
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
## 2022-04-03 NOTE — Discharge Instructions (Signed)
Your x-ray was normal.  You may wear the splint during the day as needed when walking, please remove at night.  Please continue to apply ice for 20 minutes on, 20 minutes off as many times per day as you can.  You may continue to take Tylenol/ibuprofen per package instructions as needed for pain.  Please return for any new, worsening, or change in symptoms or other concerns.  You may follow-up with orthopedics if your symptoms persist.
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
## 2022-04-03 NOTE — ED Notes (Signed)
E signature pad not working. Pt educated on discharge instructions and verbalized understanding.  

## 2023-08-14 ENCOUNTER — Emergency Department: Payer: Self-pay

## 2023-08-14 ENCOUNTER — Other Ambulatory Visit: Payer: Self-pay

## 2023-08-14 ENCOUNTER — Emergency Department
Admission: EM | Admit: 2023-08-14 | Discharge: 2023-08-14 | Disposition: A | Discharge status: Home / Self Care | Payer: Self-pay | Attending: Emergency Medicine | Admitting: Emergency Medicine

## 2023-08-14 DIAGNOSIS — Z3A09 9 weeks gestation of pregnancy: Secondary | ICD-10-CM | POA: Insufficient documentation

## 2023-08-14 DIAGNOSIS — O021 Missed abortion: Secondary | ICD-10-CM | POA: Insufficient documentation

## 2023-08-14 LAB — CBC WITH DIFFERENTIAL/PLATELET
Abs Immature Granulocytes: 0.02 10*3/uL (ref 0.00–0.07)
Basophils Absolute: 0.1 10*3/uL (ref 0.0–0.1)
Basophils Relative: 1 %
Eosinophils Absolute: 0.5 10*3/uL (ref 0.0–0.5)
Eosinophils Relative: 8 %
HCT: 37.4 % (ref 36.0–46.0)
Hemoglobin: 11.9 g/dL — ABNORMAL LOW (ref 12.0–15.0)
Immature Granulocytes: 0 %
Lymphocytes Relative: 26 %
Lymphs Abs: 1.7 10*3/uL (ref 0.7–4.0)
MCH: 26.3 pg (ref 26.0–34.0)
MCHC: 31.8 g/dL (ref 30.0–36.0)
MCV: 82.7 fL (ref 80.0–100.0)
Monocytes Absolute: 0.4 10*3/uL (ref 0.1–1.0)
Monocytes Relative: 7 %
Neutro Abs: 4 10*3/uL (ref 1.7–7.7)
Neutrophils Relative %: 58 %
Platelets: 256 10*3/uL (ref 150–400)
RBC: 4.52 MIL/uL (ref 3.87–5.11)
RDW: 15.5 % (ref 11.5–15.5)
WBC: 6.8 10*3/uL (ref 4.0–10.5)
nRBC: 0 % (ref 0.0–0.2)

## 2023-08-14 LAB — BASIC METABOLIC PANEL
Anion gap: 8 (ref 5–15)
BUN: 10 mg/dL (ref 6–20)
CO2: 21 mmol/L — ABNORMAL LOW (ref 22–32)
Calcium: 9.6 mg/dL (ref 8.9–10.3)
Chloride: 106 mmol/L (ref 98–111)
Creatinine, Ser: 0.83 mg/dL (ref 0.44–1.00)
GFR, Estimated: >60 mL/min (ref >60)
Glucose, Bld: 84 mg/dL (ref 70–99)
Potassium: 3.7 mmol/L (ref 3.5–5.1)
Sodium: 135 mmol/L (ref 135–145)

## 2023-08-14 LAB — ABO/RH: ABO/RH(D): B POS

## 2023-08-14 LAB — HCG, QUANTITATIVE, PREGNANCY: hCG, Beta Chain, Quant, S: 6096 m[IU]/mL — ABNORMAL HIGH (ref <5)

## 2023-08-14 MED ORDER — MISOPROSTOL 100 MCG PO TABS
100.0000 ug | ORAL_TABLET | Freq: Once | ORAL | Status: DC
  Filled 2023-08-14: qty 1

## 2023-08-14 MED ORDER — MISOPROSTOL 100 MCG PO TABS: 100.0000 ug | ORAL_TABLET | Freq: Once | ORAL | 0 refills | Status: DC

## 2023-08-14 MED ORDER — HYDROCODONE-ACETAMINOPHEN 5-325 MG PO TABS: 1.0000 | ORAL_TABLET | Freq: Three times a day (TID) | ORAL | 0 refills | Status: DC | PRN

## 2023-08-14 MED ORDER — ONDANSETRON 4 MG PO TBDP: 4.0000 mg | ORAL_TABLET | Freq: Three times a day (TID) | ORAL | 0 refills | Status: DC | PRN

## 2023-08-14 MED ORDER — MISOPROSTOL 200 MCG PO TABS: 800.0000 ug | ORAL_TABLET | Freq: Once | ORAL | 0 refills | Status: DC

## 2023-08-14 MED ORDER — MISOPROSTOL 200 MCG PO TABS
800.0000 ug | ORAL_TABLET | Freq: Once | ORAL | Status: DC
Start: 2023-08-14 — End: 2023-08-14
  Filled 2023-08-14: qty 4

## 2023-08-14 MED ORDER — MISOPROSTOL 100 MCG PO TABS
200.0000 ug | ORAL_TABLET | Freq: Once | ORAL | Status: AC
  Administered 2023-08-14: 200 ug via VAGINAL
  Filled 2023-08-14: qty 2

## 2023-08-14 MED ORDER — MISOPROSTOL 200 MCG PO TABS
600.0000 ug | ORAL_TABLET | Freq: Once | ORAL | Status: AC
  Administered 2023-08-14: 600 ug via VAGINAL
  Filled 2023-08-14: qty 3

## 2023-08-14 NOTE — Discharge Instructions (Addendum)
I am sorry to inform you that your ultrasound unfortunately shows that you have a pregnancy that has ended in miscarriage.  You are experiencing cramping and bleeding due to your active miscarriage.  You have opted for a medical miscarriage using misoprostol.  You will repeat the vaginal pill in 5 days if you feel you have an incomplete miscarriage.  You may follow-up with your OB provider, or any of the OB providers at [facility] or [facility].  Take the prescription pain medicine nausea medicine as needed as this process will likely cause significant pain, cramping, and nausea.  You should see your provider or one of the OB providers in the next 3 weeks for repeat pregnancy test to confirm the completion of the miscarriage process.  Lamento informarle que su ecografa desafortunadamente muestra que tiene un embarazo que ha terminado en aborto espontneo.  Est experimentando calambres y sangrado debido a su aborto espontneo activo.  Has optado por un aborto espontneo mdico con misoprostol.  Repetirs la pldora vaginal en 5 das si sientes que tienes un aborto espontneo incompleto.  Puede hacer un seguimiento con su proveedor de obstetricia o con cualquiera de los proveedores de obstetricia en [facility] o [employer] [facility].  Tome el analgsico recetado para las nuseas segn sea necesario, ya que este proceso probablemente causar dolor, calambres y nuseas significativos.  Debe consultar a su proveedor o a uno de los proveedores de obstetricia en las prximas 3 semanas para repetir la prueba de Psychiatrist y Astronomer la finalizacin del proceso de aborto espontneo.

## 2023-08-14 NOTE — ED Triage Notes (Signed)
Pt states she has been bleeding since yesterday and is 2 months pregnant. Pain in her waist. Has been passing medium sized blood clots

## 2023-08-14 NOTE — ED Provider Triage Note (Signed)
Emergency Medicine Provider Triage Evaluation Note  Sheryl Stanley , a 32 y.o. female  was evaluated in triage.  Pt complains of vaginal bleeding and is 2 months pregnant with abdominal pain. Bleeding started yesterday. Patient is 2 months by LMP.  Review of Systems  Positive: Vaginal bleeding while pregnant, abdominal pain Negative:   Physical Exam  There were no vitals taken for this visit. Gen:   Awake, no distress   Resp:  Normal effort  MSK:   Moves extremities without difficulty  Other:    Medical Decision Making  Medically screening exam initiated at 5:43 PM.  Appropriate orders placed.  Sheryl Stanley was informed that the remainder of the evaluation will be completed by another provider, this initial triage assessment does not replace that evaluation, and the importance of remaining in the ED until their evaluation is complete.     Cameron Ali, PA-C 08/14/23 [phone]

## 2023-08-14 NOTE — ED Provider Notes (Signed)
[facility] Emergency Department Provider Note     Event Date/Time   First MD Initiated Contact with Patient 08/14/23 2118     (approximate)   History   Vaginal Bleeding   HPI  History limited by Spanish language.  Tele-interpreter [phone]) used for interview and exam.  Sheryl Stanley is a 32 y.o. female G7P6 presents to the ED after noting some abdominal pelvic cramping yesterday as well as some passage with medium size clots today.  She is accompanied by her husband at this time.  She denies any fevers or chills or sweats.  She is a patient at [facility] which is where she was receiving her prenatal care.  Physical Exam   Triage Vital Signs: ED Triage Vitals  Encounter Vitals Group     BP 08/14/23 1743 (!) 144/70     Systolic BP Percentile --      Diastolic BP Percentile --      Pulse Rate 08/14/23 1743 68     Resp 08/14/23 1743 18     Temp 08/14/23 1743 98.1 F (36.7 C)     Temp Source 08/14/23 1743 Oral     SpO2 08/14/23 1743 100 %     Weight --      Height --      Head Circumference --      Peak Flow --      Pain Score 08/14/23 1749 2     Pain Loc --      Pain Education --      Exclude from Growth Chart --     Most recent vital signs: Vitals:   08/14/23 1743 08/14/23 2126  BP: (!) 144/70 127/76  Pulse: 68 63  Resp: 18 17  Temp: 98.1 F (36.7 C)   SpO2: 100% 100%    General Awake, no distress. NAD HEENT NCAT. PERRL. EOMI. No rhinorrhea. Mucous membranes are moist.  CV:  Good peripheral perfusion.  RESP:  Normal effort.  ABD:  No distention.  GYN:  declined   ED Results / Procedures / Treatments   Labs (all labs ordered are listed, but only abnormal results are displayed) Labs Reviewed  BASIC METABOLIC PANEL - Abnormal; Notable for the following components:      Result Value   CO2 21 (*)    All other components within normal limits  CBC WITH DIFFERENTIAL/PLATELET - Abnormal; Notable for the following  components:   Hemoglobin 11.9 (*)    All other components within normal limits  HCG, QUANTITATIVE, PREGNANCY - Abnormal; Notable for the following components:   hCG, Beta Chain, Quant, S 6,096 (*)    All other components within normal limits  ABO/RH     EKG   RADIOLOGY  I personally viewed and evaluated these images as part of my medical decision making, as well as reviewing the written report by the radiologist.  ED Provider Interpretation: intrauterine fetal demise  US OB Comp < 14 Wks Result Date: 08/14/2023 CLINICAL DATA:  13+ weeks by LMP.  Bleeding. EXAM: OBSTETRIC <14 WK ULTRASOUND TECHNIQUE: Transvaginal ultrasound was performed for complete evaluation of the gestation as well as the maternal uterus, adnexal regions, and pelvic cul-de-sac. COMPARISON:  None Available. FINDINGS: Intrauterine gestational sac: Single Yolk sac:  Not Visualized. Embryo:  Visualized. Cardiac Activity: Not Visualized. MSD: 3.8 cm = 9 weeks 1 day CRL:   2.1 cm = 8 weeks 5 days Subchorionic hemorrhage:  None visualized. Adnexa: No masses or fluid collections.  IMPRESSION: 9+ week gestational sac with a 9- week fetus and no evidence of fetal heart motion consistent with intrauterine fetal demise. No adnexal pathology. Electronically Signed   By: Layla Maw M.D.   On: 08/14/2023 19:41     PROCEDURES:  Critical Care performed: No  Procedures   MEDICATIONS ORDERED IN ED: Medications - No data to display    IMPRESSION / MDM / ASSESSMENT AND PLAN / ED COURSE  I reviewed the triage vital signs and the nursing notes.                              Differential diagnosis includes, but is not limited to, threatened miscarriage, incomplete miscarriage, normal bleeding from an early trimester pregnancy, ectopic pregnancy, , blighted ovum, vaginal/cervical trauma, subchorionic hemorrhage/hematoma, etc.  Patient's presentation is most consistent with acute complicated illness / injury requiring  diagnostic workup. ----------------------------------------- 10:32 PM on 08/14/2023 ----------------------------------------- S/W Dr. Edison Pace (OB): he recommends if patient wants medical management of miscarriage    - 800 mcg miso vag x 1 may repeat dose in 5   - Pregnancy test in 3 weeks  - Follow-up at any OB clinic  Patient's diagnosis is consistent with intrauterine fetal demise at approximately [redacted] weeks gestation.  Patient has been given the options for expectant management versus medical management versus potential surgical intervention with D&E.  She is opted at this time for medical management and would like to have the first dose placed in the ED.  Patient will be discharged home with prescriptions for misoprostol 800 mcg to repeat in 5 days if necessary. Patient is to follow up with one of the local OB clinics as discussed, as needed or otherwise directed. Patient is given ED precautions to return to the ED for any worsening or new symptoms.  FINAL CLINICAL IMPRESSION(S) / ED DIAGNOSES   Final diagnoses:  Intrauterine fetal death before 20 weeks of gestation     Rx / DC Orders   ED Discharge Orders          Ordered    misoprostol (CYTOTEC) 100 MCG tablet   Once,   Status:  Discontinued        08/14/23 2239    ondansetron (ZOFRAN-ODT) 4 MG disintegrating tablet  Every 8 hours PRN        08/14/23 2251    HYDROcodone-acetaminophen (NORCO) 5-325 MG tablet  3 times daily PRN        08/14/23 2251    misoprostol (CYTOTEC) 200 MCG tablet   Once        08/14/23 2255             Note:  This document was prepared using Dragon voice recognition software and may include unintentional dictation errors.    Lissa Hoard, PA-C 08/14/23 2303    Chesley Noon, MD 08/14/23 2325

## 2023-08-15 ENCOUNTER — Encounter: Payer: Self-pay | Admitting: Emergency Medicine

## 2023-08-15 ENCOUNTER — Inpatient Hospital Stay
Admission: EM | Admit: 2023-08-15 | Discharge: 2023-08-16 | DRG: 779 | Disposition: A | Discharge status: Home / Self Care | Payer: Self-pay | Attending: Obstetrics and Gynecology | Admitting: Obstetrics and Gynecology

## 2023-08-15 ENCOUNTER — Emergency Department: Payer: Self-pay

## 2023-08-15 DIAGNOSIS — O039 Complete or unspecified spontaneous abortion without complication: Principal | ICD-10-CM

## 2023-08-15 DIAGNOSIS — D62 Acute posthemorrhagic anemia: Secondary | ICD-10-CM | POA: Diagnosis present

## 2023-08-15 DIAGNOSIS — O034 Incomplete spontaneous abortion without complication: Principal | ICD-10-CM | POA: Diagnosis present

## 2023-08-15 DIAGNOSIS — O0331 Shock following incomplete spontaneous abortion: Principal | ICD-10-CM | POA: Diagnosis present

## 2023-08-15 DIAGNOSIS — Z3A09 9 weeks gestation of pregnancy: Secondary | ICD-10-CM

## 2023-08-15 LAB — HEMOGLOBIN AND HEMATOCRIT, BLOOD
HCT: 25.2 % — ABNORMAL LOW (ref 36.0–46.0)
HCT: 25.5 % — ABNORMAL LOW (ref 36.0–46.0)
Hemoglobin: 8.2 g/dL — ABNORMAL LOW (ref 12.0–15.0)
Hemoglobin: 8.3 g/dL — ABNORMAL LOW (ref 12.0–15.0)

## 2023-08-15 LAB — TYPE AND SCREEN
ABO/RH(D): B POS
Antibody Screen: NEGATIVE

## 2023-08-15 LAB — CBC
HCT: 28.7 % — ABNORMAL LOW (ref 36.0–46.0)
HCT: 24.8 % — ABNORMAL LOW (ref 36.0–46.0)
Hemoglobin: 9.3 g/dL — ABNORMAL LOW (ref 12.0–15.0)
Hemoglobin: 8.1 g/dL — ABNORMAL LOW (ref 12.0–15.0)
MCH: 26.3 pg (ref 26.0–34.0)
MCH: 26.5 pg (ref 26.0–34.0)
MCHC: 32.4 g/dL (ref 30.0–36.0)
MCHC: 32.7 g/dL (ref 30.0–36.0)
MCV: 81.3 fL (ref 80.0–100.0)
MCV: 81 fL (ref 80.0–100.0)
Platelets: 227 10*3/uL (ref 150–400)
Platelets: 185 10*3/uL (ref 150–400)
RBC: 3.53 MIL/uL — ABNORMAL LOW (ref 3.87–5.11)
RBC: 3.06 MIL/uL — ABNORMAL LOW (ref 3.87–5.11)
RDW: 15.4 % (ref 11.5–15.5)
RDW: 15.5 % (ref 11.5–15.5)
WBC: 8.5 10*3/uL (ref 4.0–10.5)
WBC: 5.1 10*3/uL (ref 4.0–10.5)
nRBC: 0 % (ref 0.0–0.2)
nRBC: 0 % (ref 0.0–0.2)

## 2023-08-15 LAB — HCG, QUANTITATIVE, PREGNANCY: hCG, Beta Chain, Quant, S: 2774 m[IU]/mL — ABNORMAL HIGH (ref <5)

## 2023-08-15 LAB — BASIC METABOLIC PANEL
Anion gap: 11 (ref 5–15)
BUN: 18 mg/dL (ref 6–20)
CO2: 18 mmol/L — ABNORMAL LOW (ref 22–32)
Calcium: 8.4 mg/dL — ABNORMAL LOW (ref 8.9–10.3)
Chloride: 102 mmol/L (ref 98–111)
Creatinine, Ser: 0.9 mg/dL (ref 0.44–1.00)
GFR, Estimated: >60 mL/min (ref >60)
Glucose, Bld: 150 mg/dL — ABNORMAL HIGH (ref 70–99)
Potassium: 3.3 mmol/L — ABNORMAL LOW (ref 3.5–5.1)
Sodium: 131 mmol/L — ABNORMAL LOW (ref 135–145)

## 2023-08-15 MED ORDER — ONDANSETRON HCL 4 MG PO TABS
4.0000 mg | ORAL_TABLET | Freq: Four times a day (QID) | ORAL | Status: DC | PRN
  Administered 2023-08-16: 4 mg via ORAL
  Filled 2023-08-15: qty 1

## 2023-08-15 MED ORDER — METHYLERGONOVINE MALEATE 0.2 MG PO TABS
0.2000 mg | ORAL_TABLET | ORAL | Status: AC
  Administered 2023-08-15: 0.2 mg via ORAL
  Filled 2023-08-15: qty 1

## 2023-08-15 MED ORDER — METHYLERGONOVINE MALEATE 0.2 MG PO TABS
0.2000 mg | ORAL_TABLET | Freq: Four times a day (QID) | ORAL | Status: DC
  Administered 2023-08-15 – 2023-08-16 (×4): 0.2 mg via ORAL
  Filled 2023-08-15 (×4): qty 1

## 2023-08-15 MED ORDER — DOCUSATE SODIUM 100 MG PO CAPS
100.0000 mg | ORAL_CAPSULE | Freq: Two times a day (BID) | ORAL | Status: DC
  Administered 2023-08-15 – 2023-08-16 (×2): 100 mg via ORAL
  Filled 2023-08-15 (×2): qty 1

## 2023-08-15 MED ORDER — SIMETHICONE 80 MG PO CHEW
80.0000 mg | CHEWABLE_TABLET | Freq: Four times a day (QID) | ORAL | Status: DC | PRN
Start: 2023-08-15 — End: 2023-08-16

## 2023-08-15 MED ORDER — SODIUM CHLORIDE 0.9 % IV BOLUS (SEPSIS)
1000.0000 mL | Freq: Once | INTRAVENOUS | Status: AC
  Administered 2023-08-15: 1000 mL via INTRAVENOUS

## 2023-08-15 MED ORDER — LACTATED RINGERS IV SOLN: INTRAVENOUS | Status: AC

## 2023-08-15 MED ORDER — SODIUM CHLORIDE 0.9 % IV BOLUS
1000.0000 mL | Freq: Once | INTRAVENOUS | Status: AC
  Administered 2023-08-15: 1000 mL via INTRAVENOUS

## 2023-08-15 MED ORDER — LACTATED RINGERS IV SOLN
INTRAVENOUS | Status: AC
Start: 2023-08-15 — End: 2023-08-16

## 2023-08-15 MED ORDER — ONDANSETRON HCL 4 MG/2ML IJ SOLN: 4.0000 mg | Freq: Four times a day (QID) | INTRAMUSCULAR | Status: DC | PRN

## 2023-08-15 NOTE — ED Provider Notes (Addendum)
[facility] Provider Note    Event Date/Time   First MD Initiated Contact with Patient 08/15/23 [phone]     (approximate)  History   Chief Complaint: Vaginal Bleeding  HPI  Sheryl Stanley is a 32 y.o. female who presents to the emergency department for vaginal bleeding after being diagnosed with miscarriage yesterday.  I reviewed the patient's chart and she appears to have a 9-week failed pregnancy diagnosed yesterday ultimately treated with Cytotec yesterday.  Patient states she was having moderate bleeding yesterday however since the Cytotec administration she has now been experiencing very heavy bleeding and several times overnight got very lightheaded and dizzy and felt like she was going to pass out so she came into the emergency department.  States moderate lower abdominal cramping.  No vomiting.  No fever.  Physical Exam   Triage Vital Signs: ED Triage Vitals  Encounter Vitals Group     BP 08/15/23 0631 (!) 89/48     Systolic BP Percentile --      Diastolic BP Percentile --      Pulse Rate 08/15/23 0631 97     Resp 08/15/23 0631 18     Temp 08/15/23 0629 97.9 F (36.6 C)     Temp Source 08/15/23 0629 Oral     SpO2 08/15/23 0631 99 %     Weight 08/15/23 0630 198 lb 6.6 oz (90 kg)     Height --      Head Circumference --      Peak Flow --      Pain Score --      Pain Loc --      Pain Education --      Exclude from Growth Chart --     Most recent vital signs: Vitals:   08/15/23 0629 08/15/23 0631  BP:  (!) 89/48  Pulse:  97  Resp:  18  Temp: 97.9 F (36.6 C)   SpO2:  99%    General: Awake, no distress.  CV:  Good peripheral perfusion.  Regular rate and rhythm  Resp:  Normal effort.  Equal breath sounds bilaterally.  Abd:  No distention.  Soft, mild suprapubic tenderness.  Otherwise benign abdomen.  ED Results / Procedures / Treatments   RADIOLOGY  Ultrasound shows 13.6 mm heterogeneous endometrium cervix dilated with  echogenic material.   MEDICATIONS ORDERED IN ED: Medications  sodium chloride 0.9 % bolus 1,000 mL (1,000 mLs Intravenous New Bag/Given 08/15/23 0656)     IMPRESSION / MDM / ASSESSMENT AND PLAN / ED COURSE  I reviewed the triage vital signs and the nursing notes.  Patient's presentation is most consistent with acute presentation with potential threat to life or bodily function.  Patient presents the emergency department for continued heavy vaginal bleeding now with episodes of lightheadedness/near syncope.  Patient is hypotensive 89/48 on arrival, after 1 L fluid patient is now around 102 systolic on my last check.  Patient states she is feeling a little better but continues to have vaginal bleeding, heavy at times.  Patient's hemoglobin has resulted showing a 2.6 point drop since yesterday (13 hours ago) prior to fluid administration.  Given the patient's continued heavy bleeding with a 2.6 point hemoglobin drop in 13 hours I spoke to Dr. Jean Rosenthal of OB/GYN.  He recommends obtaining a repeat ultrasound to see where the patient is in the miscarriage process.  Will obtain the ultrasound and continue to closely monitor while awaiting results.  Ultrasound shows echogenic material  in the cervix.  Dr. Jean Rosenthal has been down to see the patient.  Performed a pelvic examination and he removed a significant amount of clot per report.  He believe the patient is safe for discharge home recommends a dose of Methergine prior to discharge.  We will discharge after the patient receives this medication she has been given strict return precautions.  After attempting to get up patient once again had a near syncopal episode blood pressure back down to 85 systolic given a second liter of fluid and pressure is increasing currently 100/59.  Repeat hemoglobin shows a additional 1.1 point drop down to 8.2.  I spoke to Dr. Jean Rosenthal who will be admitting to his service for continued monitoring.  Patient agreeable to  plan.  FINAL CLINICAL IMPRESSION(S) / ED DIAGNOSES   Miscarriage in progress   Note:  This document was prepared using Dragon voice recognition software and may include unintentional dictation errors.   Minna Antis, MD 08/15/23 1017    Minna Antis, MD 08/15/23 1126

## 2023-08-15 NOTE — ED Notes (Signed)
Advised nurse that patient has ready bed 

## 2023-08-15 NOTE — Consult Note (Signed)
GYNECOLOGY CONSULT NOTE  GYN Consultation  Attending Provider: Minna Antis, MD   Jniah Fishburne [phone] 08/15/2023 9:39 AM    Reason for Consultation:   Sheryl Stanley is a 32 y.o. [phone] premenopausal female seen at the request of Dr. Minna Antis for evaluation of heavy bleeding associated with an active, incomplete abortion.    History of Present Ilness:   The patient presented to the ER last night after noting some bleeding. She was diagnosed with a 9 week pregnancy loss (absence of fetal heart tones). The ER provider gave her misoprostol 800 mcg, which she used overnight. She started bleeding heavily this morning and felt faint and lightheaded. She also had moderate lower abdominal cramping. So, she presented to the ER.   Though the images are not available for review (not uploaded), the report states that There is some increased thickened in the endometrium (up to 13.6 mm in the mid uterus that is favored to represent blood). The cervix appeared dilated with a large amount of uniformly echogenic material (favored to represent blood products).   By the time I saw the patient she reported much less bleeding and cramping.   Past Medical History:  Diagnosis Date   Pregnancy induced hypertension    Past Surgical History:  Procedure Laterality Date   NO PAST SURGERIES     No Known Allergies Prior to Admission medications   Medication Sig Start Date End Date Taking? Authorizing Provider  HYDROcodone-acetaminophen (NORCO) 5-325 MG tablet Take 1 tablet by mouth 3 (three) times daily as needed for up to 3 days. 08/14/23 08/17/23  Menshew, Charlesetta Ivory, PA-C  ibuprofen (ADVIL,MOTRIN) 600 MG tablet Take 1 tablet (600 mg total) by mouth every 6 (six) hours as needed. Patient not taking: Reported on 08/05/2020 09/27/17   Enid Derry, PA-C  misoprostol (CYTOTEC) 200 MCG tablet Place 4 tablets (800 mcg total) vaginally once for 1 dose. 08/14/23 08/14/23   Menshew, Charlesetta Ivory, PA-C  ondansetron (ZOFRAN-ODT) 4 MG disintegrating tablet Take 1 tablet (4 mg total) by mouth every 8 (eight) hours as needed for nausea or vomiting. 08/14/23   Menshew, Charlesetta Ivory, PA-C  Prenatal Vit-Fe Fumarate-FA (MULTIVITAMIN-PRENATAL) 27-0.8 MG TABS tablet Take 1 tablet by mouth daily at 12 noon.    [provider]    Obstetric History: She is a [id number] female s/p SVD x 6.     Social History:  She  reports that she has never smoked. She has never used smokeless tobacco. She reports that she does not currently use alcohol. She reports that she does not use drugs.  Family History:  Denies family history of breast and ovarian cancer  Review of Systems  Constitutional: Negative.   HENT: Negative.    Eyes: Negative.   Respiratory: Negative.    Cardiovascular: Negative.   Gastrointestinal:  Positive for abdominal pain (cramping).  Genitourinary: Negative.        See HPI  Musculoskeletal: Negative.   Skin: Negative.   Neurological: Negative.   Psychiatric/Behavioral: Negative.       Objective    BP 98/61   Pulse 60   Temp 97.9 F (36.6 C) (Oral)   Resp 13   Wt 90 kg   LMP 05/14/2023 (Within Days) Comment: Patient had period in september but cannot remeber date  SpO2 100%   BMI 31.08 kg/m  Physical Exam Constitutional:      General: She is not in acute distress.    Appearance: Normal appearance. She  is well-developed.  Genitourinary:     Vulva, bladder and urethral meatus normal.     Right Labia: No rash, tenderness, lesions, skin changes or Bartholin's cyst.    Left Labia: No tenderness, lesions, skin changes, Bartholin's cyst or rash.    No inguinal adenopathy present in the right or left side.    Pelvic Tanner Score: 5/5.    No cervical motion tenderness, friability, lesion or polyp.     Cervical exam comments: Dilated to about 1.5 cm.     No urethral tenderness or mass present.     Pelvic exam was performed with patient in the  lithotomy position.  HENT:     Head: Normocephalic and atraumatic.  Eyes:     General: No scleral icterus.    Conjunctiva/sclera: Conjunctivae normal.  Cardiovascular:     Rate and Rhythm: Normal rate and regular rhythm.     Heart sounds: No murmur heard.    No friction rub. No gallop.  Pulmonary:     Effort: Pulmonary effort is normal. No respiratory distress.     Breath sounds: Normal breath sounds. No wheezing or rales.  Abdominal:     General: Bowel sounds are normal. There is no distension.     Palpations: Abdomen is soft. There is no mass.     Tenderness: There is no abdominal tenderness. There is no guarding or rebound.     Hernia: There is no hernia in the left inguinal area or right inguinal area.  Musculoskeletal:        General: Normal range of motion.     Cervical back: Normal range of motion and neck supple.  Lymphadenopathy:     Lower Body: No right inguinal adenopathy. No left inguinal adenopathy.  Neurological:     General: No focal deficit present.     Mental Status: She is alert and oriented to person, place, and time.     Cranial Nerves: No cranial nerve deficit.  Skin:    General: Skin is warm and dry.     Findings: No erythema.  Psychiatric:        Mood and Affect: Mood normal.        Behavior: Behavior normal.        Judgment: Judgment normal.   On Pelvic examination: a large amount clot-like material was noted within the vagina and at the opening of the already dilated cervix. A sponge stick was utilized to clear clots and the material at the opening of the cervix. I did not need to introduce the sponge stick into the cervix in order to remove any products.There was a moderate amount of clot material and perhaps products of conception in the material that was cleared.  Once this material was cleared, there was scant bleeding from the cervix and the patient noted less cramping.   Female chaperone present for pelvic exam:   Laboratory Results:   Lab  Results  Component Value Date   WBC 8.5 08/15/2023   RBC 3.53 (L) 08/15/2023   HGB 9.3 (L) 08/15/2023   HCT 28.7 (L) 08/15/2023   PLT 227 08/15/2023   NA 131 (L) 08/15/2023   K 3.3 (L) 08/15/2023   CREATININE 0.90 08/15/2023   Imaging Results US PELVIS (TRANSABDOMINAL ONLY) Result Date: 08/15/2023 CLINICAL DATA:  Recent missed abortion.  Vaginal bleeding. EXAM: TRANSABDOMINAL AND TRANSVAGINAL ULTRASOUND OF PELVIS TECHNIQUE: Both transabdominal and transvaginal ultrasound examinations of the pelvis were performed. Transabdominal technique was performed for global imaging of the pelvis including  uterus, ovaries, adnexal regions, and pelvic cul-de-sac. It was necessary to proceed with endovaginal exam following the transabdominal exam to visualize the uterus, endometrium and ovaries. COMPARISON:  08/14/2023 FINDINGS: Uterus Measurements: 12.3 x 4.5 x 5.5 cm = volume: 157 mL. No fibroids or other mass visualized. Endometrium Thickness: 8.3 mm within the fundal portion of the endometrium and 13.6 mm within the mid uterus. The endometrium appears slightly heterogeneous. Right ovary Measurements: 2.6 x 1.4 x 1.9 cm = volume: 3.7 mL. Normal appearance/no adnexal mass. Left ovary Measurements: 2.2 x 1.3 x 1.7 cm = volume: 2.7 mL. Normal appearance/no adnexal mass. Other findings Trace free fluid within the pelvis. The cervix appears dilated containing large amount uniformly echogenic material which is favored to represent blood products. IMPRESSION: 1. The endometrium appears slightly heterogeneous measuring up to 13.6 mm. In the setting of abortion in progress this may represent residual blood products. Follow-up imaging to ensure resolution and to exclude underlying retained products. 2. The cervix appears dilated containing large amount uniformly echogenic material which is favored to represent blood products. 3. Trace free fluid within the pelvis. Electronically Signed   By: Signa Kell M.D.   On:  08/15/2023 08:13   US OB Comp < 14 Wks Result Date: 08/14/2023 CLINICAL DATA:  13+ weeks by LMP.  Bleeding. EXAM: OBSTETRIC <14 WK ULTRASOUND TECHNIQUE: Transvaginal ultrasound was performed for complete evaluation of the gestation as well as the maternal uterus, adnexal regions, and pelvic cul-de-sac. COMPARISON:  None Available. FINDINGS: Intrauterine gestational sac: Single Yolk sac:  Not Visualized. Embryo:  Visualized. Cardiac Activity: Not Visualized. MSD: 3.8 cm = 9 weeks 1 day CRL:   2.1 cm = 8 weeks 5 days Subchorionic hemorrhage:  None visualized. Adnexa: No masses or fluid collections. IMPRESSION: 9+ week gestational sac with a 9- week fetus and no evidence of fetal heart motion consistent with intrauterine fetal demise. No adnexal pathology. Electronically Signed   By: Layla Maw M.D.   On: 08/14/2023 19:41      Assessment & Recommendations   Nobie Diana Eves is a 32 y.o. [phone] premenopausal female with incomplete miscarriage (mostly in vagina and cervix) being seen in consultation.  I believe she had mostly resolved the miscarriage at the time I performed a pelvic exam. I did help clear away clots and debris in order to gain visualization of the cervix.  I believe this has resolved the issue for her. Not being able to see the imaging itself (not uploaded) I had to rely on the report.  Based on the report she may have a very small amount of blood or POC remaining in the uterus. However, she should be able to pass this on her own.  She was given strict precautions on when to return to the ER (heavy vaginal bleeding such that she is soaking 1 pad or greater per hour) or other concerning symptoms.  Her vitals were stable at this time and she had dramatically improved clinically.  She agreed to discharge with follow up with [facility].  Based on her clinical stability and the high likelihood that she has now passed nearly all products of conception, we discussed that a D&C in the OR  is likely unnecessary and would be of little to no benefit for her. She voiced agreement.   She was given one dose of methergine prior to discharge to help with ongoing bleeding issues.   The entireity of the interview, exam, and discussion was carried out with the aid  of a hospital provided interpreter via video and audio (though turned away from the patient during  examination portions).  The interpreter was Denyce Robert #[id number] using the service provided by the hospital.      Thomasene Mohair, MD 08/15/2023 9:39 AM

## 2023-08-15 NOTE — ED Notes (Signed)
OB to bedside for pelvic, this RN as chaperone. Multiple clots cleared from pt's vagina. Interpreter at bedside for use by OB/GYN.

## 2023-08-15 NOTE — ED Notes (Signed)
IV fluids started in triage while waiting for room d/t hypotension.

## 2023-08-15 NOTE — ED Notes (Signed)
This RN called to bedside by PCT Brayton Caves, for pt. Getting light headed and fainting while on toilet. This RN entered room, pt. Is sitting on bedside commode, pale, eyes open. Pt. Verbalizes through husband as Nurse, learning disability that she feels dizzy. This RN, Brayton Caves PCT, and [facility] assist pt. Back to bed, lay head back and obtain set of VS. Dr. Lenard Lance notified, ordered 1L NS bolus verbal and arrived to bedside. Dr. Lenard Lance explained to pt. And spouse that pt. Would need to remain in hospital overnight to be observed. Repeat Hgb obtained. Will continue to monitor pt.

## 2023-08-15 NOTE — ED Notes (Signed)
The pt is lying supine in the bed with her head slightly elevated. The pt was warm, pale, and dry. The pt advised she is feeling better. The pt's husband is at her bedside.

## 2023-08-15 NOTE — H&P (Signed)
GYNECOLOGY ADMISSION HISTORY AND PHYSICAL NOTE   GYN Admision    Arleda Lahiff [id number] 08/15/2023 9:39 AM     Chief complaint:   Sheryl Stanley is a 32 y.o. [phone] premenopausal female seen at the request of Dr. Minna Antis for evaluation of heavy bleeding associated with an active, incomplete abortion.     History of Present Ilness:   The patient presented to the ER last night after noting some bleeding. She was diagnosed with a 9 week pregnancy loss (absence of fetal heart tones). The ER provider gave her misoprostol 800 mcg, which she used overnight. She started bleeding heavily this morning and felt faint and lightheaded. She also had moderate lower abdominal cramping. So, she presented to the ER.    Though the images are not available for review (not uploaded), the report states that There is some increased thickened in the endometrium (up to 13.6 mm in the mid uterus that is favored to represent blood). The cervix appeared dilated with a large amount of uniformly echogenic material (favored to represent blood products).    By the time I saw the patient she reported much less bleeding and cramping.        Past Medical History:  Diagnosis Date   Pregnancy induced hypertension               Past Surgical History:  Procedure Laterality Date   NO PAST SURGERIES            Allergies  No Known Allergies          Prior to Admission medications   Medication Sig Start Date End Date Taking? Authorizing Provider  HYDROcodone-acetaminophen (NORCO) 5-325 MG tablet Take 1 tablet by mouth 3 (three) times daily as needed for up to 3 days. 08/14/23 08/17/23   Menshew, Charlesetta Ivory, PA-C  ibuprofen (ADVIL,MOTRIN) 600 MG tablet Take 1 tablet (600 mg total) by mouth every 6 (six) hours as needed. Patient not taking: Reported on 08/05/2020 09/27/17     Enid Derry, PA-C  misoprostol (CYTOTEC) 200 MCG tablet Place 4 tablets (800 mcg total) vaginally once for 1  dose. 08/14/23 08/14/23   Menshew, Charlesetta Ivory, PA-C  ondansetron (ZOFRAN-ODT) 4 MG disintegrating tablet Take 1 tablet (4 mg total) by mouth every 8 (eight) hours as needed for nausea or vomiting. 08/14/23     Menshew, Charlesetta Ivory, PA-C  Prenatal Vit-Fe Fumarate-FA (MULTIVITAMIN-PRENATAL) 27-0.8 MG TABS tablet Take 1 tablet by mouth daily at 12 noon.       [provider]      Obstetric History: She is a [id number] female s/p SVD x 6.       Social History:  She  reports that she has never smoked. She has never used smokeless tobacco. She reports that she does not currently use alcohol. She reports that she does not use drugs.   Family History:  Denies family history of breast and ovarian cancer   Review of Systems  Constitutional: Negative.   HENT: Negative.    Eyes: Negative.   Respiratory: Negative.    Cardiovascular: Negative.   Gastrointestinal:  Positive for abdominal pain (cramping).  Genitourinary: Negative.        See HPI  Musculoskeletal: Negative.   Skin: Negative.   Neurological: Negative.   Psychiatric/Behavioral: Negative.        Objective     BP 98/61   Pulse 60   Temp 97.9 F (36.6 C) (Oral)   Resp  13   Wt 90 kg   LMP 05/14/2023 (Within Days) Comment: Patient had period in september but cannot remeber date  SpO2 100%   BMI 31.08 kg/m  Physical Exam Constitutional:      General: She is not in acute distress.    Appearance: Normal appearance. She is well-developed.  Genitourinary:     Vulva, bladder and urethral meatus normal.     Right Labia: No rash, tenderness, lesions, skin changes or Bartholin's cyst.    Left Labia: No tenderness, lesions, skin changes, Bartholin's cyst or rash.    No inguinal adenopathy present in the right or left side.    Pelvic Tanner Score: 5/5.    No cervical motion tenderness, friability, lesion or polyp.     Cervical exam comments: Dilated to about 1.5 cm.     No urethral tenderness or mass present.     Pelvic  exam was performed with patient in the lithotomy position.  HENT:     Head: Normocephalic and atraumatic.  Eyes:     General: No scleral icterus.    Conjunctiva/sclera: Conjunctivae normal.  Cardiovascular:     Rate and Rhythm: Normal rate and regular rhythm.     Heart sounds: No murmur heard.    No friction rub. No gallop.  Pulmonary:     Effort: Pulmonary effort is normal. No respiratory distress.     Breath sounds: Normal breath sounds. No wheezing or rales.  Abdominal:     General: Bowel sounds are normal. There is no distension.     Palpations: Abdomen is soft. There is no mass.     Tenderness: There is no abdominal tenderness. There is no guarding or rebound.     Hernia: There is no hernia in the left inguinal area or right inguinal area.  Musculoskeletal:        General: Normal range of motion.     Cervical back: Normal range of motion and neck supple.  Lymphadenopathy:     Lower Body: No right inguinal adenopathy. No left inguinal adenopathy.  Neurological:     General: No focal deficit present.     Mental Status: She is alert and oriented to person, place, and time.     Cranial Nerves: No cranial nerve deficit.  Skin:    General: Skin is warm and dry.     Findings: No erythema.  Psychiatric:        Mood and Affect: Mood normal.        Behavior: Behavior normal.        Judgment: Judgment normal.    On Pelvic examination: a large amount clot-like material was noted within the vagina and at the opening of the already dilated cervix. A sponge stick was utilized to clear clots and the material at the opening of the cervix. I did not need to introduce the sponge stick into the cervix in order to remove any products.There was a moderate amount of clot material and perhaps products of conception in the material that was cleared.  Once this material was cleared, there was scant bleeding from the cervix and the patient noted less cramping.    Female chaperone present for pelvic  exam:    Laboratory Results:   Recent Labs       Lab Results  Component Value Date    WBC 8.5 08/15/2023    RBC 3.53 (L) 08/15/2023    HGB 9.3 (L) 08/15/2023    HCT 28.7 (L) 08/15/2023    PLT  227 08/15/2023    NA 131 (L) 08/15/2023    K 3.3 (L) 08/15/2023    CREATININE 0.90 08/15/2023      Imaging Results US PELVIS (TRANSABDOMINAL ONLY) Result Date: 08/15/2023 CLINICAL DATA:  Recent missed abortion.  Vaginal bleeding. EXAM: TRANSABDOMINAL AND TRANSVAGINAL ULTRASOUND OF PELVIS TECHNIQUE: Both transabdominal and transvaginal ultrasound examinations of the pelvis were performed. Transabdominal technique was performed for global imaging of the pelvis including uterus, ovaries, adnexal regions, and pelvic cul-de-sac. It was necessary to proceed with endovaginal exam following the transabdominal exam to visualize the uterus, endometrium and ovaries. COMPARISON:  08/14/2023 FINDINGS: Uterus Measurements: 12.3 x 4.5 x 5.5 cm = volume: 157 mL. No fibroids or other mass visualized. Endometrium Thickness: 8.3 mm within the fundal portion of the endometrium and 13.6 mm within the mid uterus. The endometrium appears slightly heterogeneous. Right ovary Measurements: 2.6 x 1.4 x 1.9 cm = volume: 3.7 mL. Normal appearance/no adnexal mass. Left ovary Measurements: 2.2 x 1.3 x 1.7 cm = volume: 2.7 mL. Normal appearance/no adnexal mass. Other findings Trace free fluid within the pelvis. The cervix appears dilated containing large amount uniformly echogenic material which is favored to represent blood products. IMPRESSION: 1. The endometrium appears slightly heterogeneous measuring up to 13.6 mm. In the setting of abortion in progress this may represent residual blood products. Follow-up imaging to ensure resolution and to exclude underlying retained products. 2. The cervix appears dilated containing large amount uniformly echogenic material which is favored to represent blood products. 3. Trace free fluid within  the pelvis. Electronically Signed   By: Signa Kell M.D.   On: 08/15/2023 08:13    US OB Comp < 14 Wks Result Date: 08/14/2023 CLINICAL DATA:  13+ weeks by LMP.  Bleeding. EXAM: OBSTETRIC <14 WK ULTRASOUND TECHNIQUE: Transvaginal ultrasound was performed for complete evaluation of the gestation as well as the maternal uterus, adnexal regions, and pelvic cul-de-sac. COMPARISON:  None Available. FINDINGS: Intrauterine gestational sac: Single Yolk sac:  Not Visualized. Embryo:  Visualized. Cardiac Activity: Not Visualized. MSD: 3.8 cm = 9 weeks 1 day CRL:   2.1 cm = 8 weeks 5 days Subchorionic hemorrhage:  None visualized. Adnexa: No masses or fluid collections. IMPRESSION: 9+ week gestational sac with a 9- week fetus and no evidence of fetal heart motion consistent with intrauterine fetal demise. No adnexal pathology. Electronically Signed   By: Layla Maw M.D.   On: 08/14/2023 19:41       Assessment & plan    Briyona Diana Eves is a 32 y.o. [phone] premenopausal female with incomplete miscarriage (mostly in vagina and cervix) being seen in consultation.  I believe she had mostly resolved the miscarriage at the time I performed a pelvic exam. I did help clear away clots and debris in order to gain visualization of the cervix.  I believe this has resolved the issue for her. Not being able to see the imaging itself (not uploaded) I had to rely on the report.  Based on the report she may have a very small amount of blood or POC remaining in the uterus. However, she should be able to pass this on her own.  She was given strict precautions on when to return to the ER (heavy vaginal bleeding such that she is soaking 1 pad or greater per hour) or other concerning symptoms.  Her vitals were stable at this time and she had dramatically improved clinically.  She agreed to discharge with follow up with [facility].  Based on her clinical stability and the high likelihood that she has now passed nearly all  products of conception, we discussed that a D&C in the OR is likely unnecessary and would be of little to no benefit for her. She voiced agreement.   She was given one dose of methergine prior to discharge to help with ongoing bleeding issues.    The entireity of the interview, exam, and discussion was carried out with the aid of a hospital provided interpreter via video and audio (though turned away from the patient during  examination portions).  The interpreter was Denyce Robert #[id number] using the service provided by the hospital.       Thomasene Mohair, MD 08/15/2023 9:39 AM   ADDENDUM: After removing products of conception from vaginal vault, the patient has been hypotensive and presyncopal. Will admit for observation and if necessary take her to the OR, if bleeding is heavy.  Monitor closely for bleeding and need for blood products.    Thomasene Mohair, MD, [facility] 08/15/2023 11:30 AM

## 2023-08-15 NOTE — ED Triage Notes (Signed)
Patient seen here yesterday, was diagnosed with a miscarriage, and given medication to induce abortion.  Patient reports she went home and has been having vaginal bleeding, so she came back.

## 2023-08-16 LAB — CBC WITH DIFFERENTIAL/PLATELET
Abs Immature Granulocytes: 0.02 10*3/uL (ref 0.00–0.07)
Basophils Absolute: 0 10*3/uL (ref 0.0–0.1)
Basophils Relative: 1 %
Eosinophils Absolute: 0.3 10*3/uL (ref 0.0–0.5)
Eosinophils Relative: 6 %
HCT: 21.6 % — ABNORMAL LOW (ref 36.0–46.0)
Hemoglobin: 7.3 g/dL — ABNORMAL LOW (ref 12.0–15.0)
Immature Granulocytes: 0 %
Lymphocytes Relative: 45 %
Lymphs Abs: 2.1 10*3/uL (ref 0.7–4.0)
MCH: 27.2 pg (ref 26.0–34.0)
MCHC: 33.8 g/dL (ref 30.0–36.0)
MCV: 80.6 fL (ref 80.0–100.0)
Monocytes Absolute: 0.3 10*3/uL (ref 0.1–1.0)
Monocytes Relative: 7 %
Neutro Abs: 1.9 10*3/uL (ref 1.7–7.7)
Neutrophils Relative %: 41 %
Platelets: 164 10*3/uL (ref 150–400)
RBC: 2.68 MIL/uL — ABNORMAL LOW (ref 3.87–5.11)
RDW: 15.5 % (ref 11.5–15.5)
WBC: 4.6 10*3/uL (ref 4.0–10.5)
nRBC: 0 % (ref 0.0–0.2)

## 2023-08-16 MED ORDER — ACETAMINOPHEN 500 MG PO TABS
1000.0000 mg | ORAL_TABLET | Freq: Four times a day (QID) | ORAL | Status: DC | PRN
  Administered 2023-08-16 (×2): 1000 mg via ORAL
  Filled 2023-08-16 (×2): qty 2

## 2023-08-16 MED ORDER — IRON SUCROSE 300 MG IVPB - SIMPLE MED
300.0000 mg | Freq: Once | Status: AC
  Administered 2023-08-16: 300 mg via INTRAVENOUS
  Filled 2023-08-16: qty 300

## 2023-08-16 MED ORDER — ACETAMINOPHEN 325 MG PO TABS: 650.0000 mg | ORAL_TABLET | ORAL | Status: AC | PRN

## 2023-08-16 MED ORDER — AMMONIA AROMATIC IN INHA
RESPIRATORY_TRACT | Status: AC
  Filled 2023-08-16: qty 10

## 2023-08-16 MED ORDER — IBUPROFEN 100 MG/5ML PO SUSP: 600.0000 mg | Freq: Four times a day (QID) | ORAL | Status: AC | PRN

## 2023-08-16 NOTE — Discharge Summary (Signed)
Patient ID: Sheryl Stanley MRN: [id number] DOB/AGE: 26-Oct-1990 32 y.o.  Admit date: 08/15/2023 Discharge date: 08/16/2023  Admission Diagnoses: Sheryl Stanley is a 32 y.o. [phone] premenopausal female admitted for evaluation of heavy bleeding associated with an an active, incomplete abortion.   Discharge Diagnoses: She was diagnosed with incomplete miscarriage (mostly in vagina and cervix) upon discharge.   Significant Diagnostic Studies:  Results for orders placed or performed during the hospital encounter of 08/15/23 (from the past week)  CBC   Collection Time: 08/15/23  6:57 AM  Result Value Ref Range   WBC 8.5 4.0 - 10.5 K/uL   RBC 3.53 (L) 3.87 - 5.11 MIL/uL   Hemoglobin 9.3 (L) 12.0 - 15.0 g/dL   HCT 14.7 (L) 82.9 - 56.2 %   MCV 81.3 80.0 - 100.0 fL   MCH 26.3 26.0 - 34.0 pg   MCHC 32.4 30.0 - 36.0 g/dL   RDW 13.0 86.5 - 78.4 %   Platelets 227 150 - 400 K/uL   nRBC 0.0 0.0 - 0.2 %  Basic metabolic panel   Collection Time: 08/15/23  6:57 AM  Result Value Ref Range   Sodium 131 (L) 135 - 145 mmol/L   Potassium 3.3 (L) 3.5 - 5.1 mmol/L   Chloride 102 98 - 111 mmol/L   CO2 18 (L) 22 - 32 mmol/L   Glucose, Bld 150 (H) 70 - 99 mg/dL   BUN 18 6 - 20 mg/dL   Creatinine, Ser 6.96 0.44 - 1.00 mg/dL   Calcium 8.4 (L) 8.9 - 10.3 mg/dL   GFR, Estimated >29 >52 mL/min   Anion gap 11 5 - 15  hCG, quantitative, pregnancy   Collection Time: 08/15/23  6:57 AM  Result Value Ref Range   hCG, Beta Chain, Quant, S 2,774 (H) <5 mIU/mL  Type and screen [facility]   Collection Time: 08/15/23  6:57 AM  Result Value Ref Range   ABO/RH(D) B POS    Antibody Screen NEG    Sample Expiration      08/18/2023,2359 Performed at [facility], [address] [place], [place] [address]   Hemoglobin and hematocrit, blood   Collection Time: 08/15/23 10:31 AM  Result Value Ref Range   Hemoglobin 8.2 (L) 12.0 - 15.0 g/dL   HCT 44.0 (L) 10.2 - 72.5  %  Hemoglobin and hematocrit, blood   Collection Time: 08/15/23  2:18 PM  Result Value Ref Range   Hemoglobin 8.3 (L) 12.0 - 15.0 g/dL   HCT 36.6 (L) 44.0 - 34.7 %  CBC   Collection Time: 08/15/23  8:44 PM  Result Value Ref Range   WBC 5.1 4.0 - 10.5 K/uL   RBC 3.06 (L) 3.87 - 5.11 MIL/uL   Hemoglobin 8.1 (L) 12.0 - 15.0 g/dL   HCT 42.5 (L) 95.6 - 38.7 %   MCV 81.0 80.0 - 100.0 fL   MCH 26.5 26.0 - 34.0 pg   MCHC 32.7 30.0 - 36.0 g/dL   RDW 56.4 33.2 - 95.1 %   Platelets 185 150 - 400 K/uL   nRBC 0.0 0.0 - 0.2 %  CBC with Differential/Platelet   Collection Time: 08/16/23  7:15 AM  Result Value Ref Range   WBC 4.6 4.0 - 10.5 K/uL   RBC 2.68 (L) 3.87 - 5.11 MIL/uL   Hemoglobin 7.3 (L) 12.0 - 15.0 g/dL   HCT 88.4 (L) 16.6 - 06.3 %   MCV 80.6 80.0 - 100.0 fL  MCH 27.2 26.0 - 34.0 pg   MCHC 33.8 30.0 - 36.0 g/dL   RDW 62.1 30.8 - 65.7 %   Platelets 164 150 - 400 K/uL   nRBC 0.0 0.0 - 0.2 %   Neutrophils Relative % 41 %   Neutro Abs 1.9 1.7 - 7.7 K/uL   Lymphocytes Relative 45 %   Lymphs Abs 2.1 0.7 - 4.0 K/uL   Monocytes Relative 7 %   Monocytes Absolute 0.3 0.1 - 1.0 K/uL   Eosinophils Relative 6 %   Eosinophils Absolute 0.3 0.0 - 0.5 K/uL   Basophils Relative 1 %   Basophils Absolute 0.0 0.0 - 0.1 K/uL   Immature Granulocytes 0 %   Abs Immature Granulocytes 0.02 0.00 - 0.07 K/uL  Results for orders placed or performed during the hospital encounter of 08/14/23 (from the past week)  hCG, quantitative, pregnancy   Collection Time: 08/14/23  5:45 PM  Result Value Ref Range   hCG, Beta Chain, Quant, S 6,096 (H) <5 mIU/mL  Basic metabolic panel   Collection Time: 08/14/23  5:48 PM  Result Value Ref Range   Sodium 135 135 - 145 mmol/L   Potassium 3.7 3.5 - 5.1 mmol/L   Chloride 106 98 - 111 mmol/L   CO2 21 (L) 22 - 32 mmol/L   Glucose, Bld 84 70 - 99 mg/dL   BUN 10 6 - 20 mg/dL   Creatinine, Ser 8.46 0.44 - 1.00 mg/dL   Calcium 9.6 8.9 - 96.2 mg/dL   GFR, Estimated  >95 >28 mL/min   Anion gap 8 5 - 15  CBC with Differential   Collection Time: 08/14/23  5:48 PM  Result Value Ref Range   WBC 6.8 4.0 - 10.5 K/uL   RBC 4.52 3.87 - 5.11 MIL/uL   Hemoglobin 11.9 (L) 12.0 - 15.0 g/dL   HCT 41.3 24.4 - 01.0 %   MCV 82.7 80.0 - 100.0 fL   MCH 26.3 26.0 - 34.0 pg   MCHC 31.8 30.0 - 36.0 g/dL   RDW 27.2 53.6 - 64.4 %   Platelets 256 150 - 400 K/uL   nRBC 0.0 0.0 - 0.2 %   Neutrophils Relative % 58 %   Neutro Abs 4.0 1.7 - 7.7 K/uL   Lymphocytes Relative 26 %   Lymphs Abs 1.7 0.7 - 4.0 K/uL   Monocytes Relative 7 %   Monocytes Absolute 0.4 0.1 - 1.0 K/uL   Eosinophils Relative 8 %   Eosinophils Absolute 0.5 0.0 - 0.5 K/uL   Basophils Relative 1 %   Basophils Absolute 0.1 0.0 - 0.1 K/uL   Immature Granulocytes 0 %   Abs Immature Granulocytes 0.02 0.00 - 0.07 K/uL  ABO/Rh   Collection Time: 08/14/23  5:48 PM  Result Value Ref Range   ABO/RH(D)      B POS Performed at [facility], [address] [place], [place] [address]     Treatments: IV continuous fluids and IV Venofer   Hospital Course:  Sheryl Stanley 32 y.o. Sheryl Stanley was admitted after presenting to the ED on 12/22 after noting bleeding. She was diagnosed with a 9 week pregnancy loss (no fetal heart tones). She was administered 800 mcg misoprostol, which she took overnight. Reports heavy bleeding started on the morning of 12/22 and she felt faint and lightheaded. She also stated she had lower abdominal cramping which made her go to the ED. Overnight (on 12/22) and the morning (of 12/23), she was treated  with IV continuous fluid, given four doses of methergine to manage heavy bleeding, Tylenol 1,000 mg q 6hrs prn for pain management, and Zofran for nausea. She reported still feeling lightheaded and faint when ambulating from the bed to the bathroom. Her hgb was 7.3. After administration of IV Venofer (on 12/23), she reported feeling better and denied lightheadedness and feeling  faint. She was tolerating PO intake, ambulating without difficulty, voiding spontaneously, and pain was well controlled with PO medication. She denied cramping and heavy bleeding. She was deemed stable for discharge to home with outpatient follow-up.  Review of Systems  Constitutional:  Negative for chills, fever and malaise/fatigue.  Eyes:  Negative for blurred vision and double vision.  Respiratory: Negative.    Cardiovascular: Negative.   Gastrointestinal:  Negative for abdominal pain.  Genitourinary: Negative.   Neurological:  Negative for dizziness and weakness.  Psychiatric/Behavioral: Negative.       Discharge Physical Exam:  BP 120/66 (BP Location: Right Arm)   Pulse 65   Temp 99.1 F (37.3 C)   Resp 19   Wt 90 kg   LMP 05/14/2023 (Within Days) Comment: Patient had period in september but cannot remeber date  SpO2 100%   BMI 31.08 kg/m   Vitals:   08/16/23 1126 08/16/23 1528  BP: (!) 98/43 120/66  Pulse: 63 65  Resp: 18 19  Temp: 98.7 F (37.1 C) 99.1 F (37.3 C)  SpO2:       Physical Exam:  General: alert, cooperative, appears stated age, and no distress Pulm: nl effort Abdomen: soft, non-tender Lochia: appropriate DVT Evaluation: No evidence of DVT seen on physical exam.  Plan: - Continue taking PO iron (M,W, F) - Follow-up with [facility] on 08/19/2024.   Discharge Condition: Stable  Disposition: Discharge disposition: 01-Home or Self Care      Discharge Instructions     Discharge patient   Complete by: As directed    Discharge disposition: 01-Home or Self Care   Discharge patient date: 08/16/2023      Allergies as of 08/16/2023   No Known Allergies      Medication List     STOP taking these medications    HYDROcodone-acetaminophen 5-325 MG tablet Commonly known as: Norco   ibuprofen 600 MG tablet Commonly known as: ADVIL Replaced by: ibuprofen 100 MG/5ML suspension   misoprostol 200 MCG  tablet Commonly known as: CYTOTEC   multivitamin-prenatal 27-0.8 MG Tabs tablet   ondansetron 4 MG disintegrating tablet Commonly known as: ZOFRAN-ODT       TAKE these medications    acetaminophen 325 MG tablet Commonly known as: Tylenol Take 2 tablets (650 mg total) by mouth every 4 (four) hours as needed.   ibuprofen 100 MG/5ML suspension Commonly known as: ADVIL Take 30 mLs (600 mg total) by mouth every 6 (six) hours as needed. Replaces: ibuprofen 600 MG tablet   IRON PO Take 1 tablet by mouth daily.         Follow-up Information     [facility] Follow up in 2 day(s).   Specialty: General Practice Contact information: [address] [place] [address] [phone]                 Signed:  Roney Jaffe, CNM 08/16/2023 10:08 PM

## 2023-08-16 NOTE — Progress Notes (Signed)
Patient discharged home with family.  Discharge instructions, when to follow up, and prescriptions reviewed with patient.  Patient verbalized understanding. Patient will be escorted out by auxiliary.   

## 2023-08-16 NOTE — Progress Notes (Signed)
GYNECOLOGY PROGRESS NOTE   Subjective: Holden Hueston is a 32 y.o. [phone] premenopausal female who reports feeling better. She states she still experiences episodes of lightheadedness and faintness when ambulating from the bed to the bathroom. She is tolerating PO fluids and foods, voiding spontaneously, and her pain is well controlled on PO pain medication. Reports minimal bleeding. Denies cramping and heavy bleeding.   Objective: BP (!) 101/49 (BP Location: Right Arm) Comment: nurse Jasmine notified  Pulse 62   Temp 98.3 F (36.8 C) (Oral)   Resp 20   Wt 90 kg   LMP 05/14/2023 (Within Days) Comment: Patient had period in september but cannot remeber date  SpO2 100%   BMI 31.08 kg/m       08/16/2023    8:09 AM 08/16/2023    6:25 AM 08/16/2023    3:21 AM  Vitals with BMI  Systolic 101 118 97  Diastolic 49 43 48  Pulse 62 60      Physical Exam:  General: alert, cooperative, appears stated age, and no distress Pulm: nl effort Abdomen: soft, non-tender Lochia: appropriate DVT Evaluation: No evidence of DVT seen on physical exam.  Recent Labs    08/15/23 2044 08/16/23 0715  HGB 8.1* 7.3*  HCT 24.8* 21.6*  WBC 5.1 4.6  PLT 185 164    Assessment/Plan: Alyn Denoncourt is a 32 y.o. [id number] who had an incomplete miscarriage.    Acute blood loss anemia - clinically significant.  -Symptomatic--> Reports feeling faint and lightheaded when ambulating  - Hgb: 7.3 (08/16/2023) -Intervention: IV iron transfusion with venofer ordered   2. Encourage ambulation as tolerated in room or in hallway with assistance of nurse  3. Discontinue continuous IV fluids  - Encourage PO intake   Disposition: plan for discharge home tomorrow     Roney Jaffe, CNM 08/16/2023, 8:48 AM   ----- Roney Jaffe Certified Nurse Midwife [facility]

## 2024-03-30 NOTE — ED Provider Notes (Signed)
 Emergency Department Provider Note Room: 10/10  Medical Decision Making  33 year old female with past medical history as below G7 P5-1-0-6 presenting to emergency department reports vaginal bleeding and reports she is [redacted] weeks pregnant however also reports she had a miscarriage 1 month ago.  Patient went through 1 pad in 12 hours. Vitals signs stable.  Exam unremarkable. Differential includes but is not limited to IUP, ectopic pregnancy, abortion Assessment & Plan Threatened abortion in first trimester Nine weeks pregnant with vaginal bleeding since yesterday.  However, timeline is confusing as patient reports recent miscarriage last month. Ultrasound and blood work are necessary to assess the viability of the pregnancy and ensure it is in the correct location. Further management will be guided by OB/GYN based on hormone trends and follow-up evaluations. Explained that if there is something abnormal, there may not be anything that can be done at this stage, but it is important to ensure the pregnancy is in the right spot and the bleeding is not from an abnormality. - Perform ultrasound to assess pregnancy location and viability - Obtain blood work to evaluate pregnancy hormone levels - Refer to OB/GYN for follow-up and management of pregnancy     Progress Notes       ___   MDM Elements  Independent interpretation: Ultrasound(s) - single live IUP.  Escalation of Care including OBS/Admission/Transfer was considered: However, patient was determined to be appropriate for outpatient management. See progress note for additional detail.   Diagnostic tests considered but not performed: Additional labs and imaging considered, however not indicated   Disposition   Clinical Impression:  Final diagnoses:  Intrauterine pregnancy ([facility]HS-HCC) (Primary)  Vaginal spotting  Threatened miscarriage ([facility]HS-HCC)    Final Disposition: Discharge   History   Chief Complaint:  Vaginal Bleeding  (Patient from home with complaints of vaginal bleeding. Patient states she is nine weeks pregnant. States that bleeding is minimal as she has only had to change the pad once since 11. Endorses intermittent abdominal pain. )    HPI:   History of Present Illness Sheryl Stanley is a 33 year old female who presents with vaginal bleeding at nine weeks of pregnancy.  Vaginal bleeding began yesterday around 11 AM. She is currently nine weeks pregnant and is experiencing significant anxiety due to a recent miscarriage last month, which heightens her concern about the current bleeding.  She has been under the care of an OB/GYN and has been prescribed medication and vitamins. A follow-up appointment is scheduled for the 19th of this month for blood work and possibly an ultrasound.  Outside Historian(s): None and Significant other:    MEDICATIONS:  Patient's Medications  New Prescriptions   No medications on file  Previous Medications   ACETAMINOPHEN  (TYLENOL ) 325 MG TABLET    Take 2 tablets (650 mg total) by mouth every six (6) hours as needed.   FERROUS SULFATE  325 (65 FE) MG TABLET    Take 1 tablet (325 mg total) by mouth every other day.   IBUPROFEN  200 MG CAP    Take 600 mg by mouth every six (6) hours as needed (pain).   MULTIVITAMIN, PRENATAL, FOLIC ACID-IRON , 27-1 MG TAB    Take 1 tablet by mouth daily.  Modified Medications   No medications on file  Discontinued Medications   No medications on file    ALLERGIES: Allergies[1]  PAST MEDICAL HISTORY:  Past Medical History[2]  PAST SURGICAL HISTORY: Past Surgical History[3]  SOCIAL HISTORY:  Social History  Tobacco Use  . Smoking status: Never  . Smokeless tobacco: Never  Substance Use Topics  . Alcohol use: Never    FAMILY HISTORY:  Family History[4]   Physical Exam   Vitals:   03/30/24 0301  BP:   Pulse:   Resp:   Temp:   SpO2: 100%    Physical Exam GENERAL: Alert, cooperative, well developed, no acute  distress HEENT: Normocephalic, normal oropharynx, moist mucous membranes CHEST: Clear to auscultation bilaterally, No wheezes, rhonchi, or crackles CARDIOVASCULAR: Normal heart rate and rhythm, S1 and S2 normal without murmurs ABDOMEN: Soft, non-tender, non-distended, without organomegaly, Normal bowel sounds EXTREMITIES: No cyanosis or edema NEUROLOGICAL: Cranial nerves grossly intact, Moves all extremities without gross motor or sensory deficit   Results   Pertinent labs & imaging results that were available during my care of the patient were reviewed by me and considered in my medical decision making (see chart for details).  Results    Results for orders placed or performed during the hospital encounter of 03/30/24  Comprehensive Metabolic Panel  Result Value Ref Range   Sodium 138 136 - 145 mmol/L   Potassium 4.1 3.5 - 5.1 mmol/L   Chloride 108 (H) 98 - 107 mmol/L   CO2 23.0 20.0 - 31.0 mmol/L   Anion Gap 7 5 - 15 mmol/L   BUN 8 (L) 9 - 23 mg/dL   Creatinine 9.47 9.49 - 0.80 mg/dL   BUN/Creatinine Ratio 15 15 - 24   eGFR CKD-EPI (2021) Female >90 >=60 mL/min/1.45m2   Glucose 91 70 - 99 mg/dL   Calcium 9.5 8.3 - 89.3 mg/dL   Albumin 3.3 1.3 - 5.0 g/dL   Total Protein 6.6 6.1 - 8.0 g/dL   Total Bilirubin 0.2 (L) 0.3 - 1.2 mg/dL   AST 18 13 - 40 U/L   ALT 16 7 - 40 U/L   Alkaline Phosphatase 55 46 - 116 U/L   Globulin, Total 3.3 2.3 - 3.5 g/dL   Osmolality Calculated 274 266 - 308 mOsm/kg   Albumin/Globulin Ratio 1.0 (L) 1.1 - 1.8  Magnesium  Result Value Ref Range   Magnesium 1.8 1.6 - 2.6 mg/dL  hCG, Quantitative, Pregnancy  Result Value Ref Range   hCG Quantitative 50,236.5 (H) <=4.2 mIU/mL  ABO/Rh  Result Value Ref Range   GELABORH B POS   CBC w/ Differential  Result Value Ref Range   WBC 5.1 3.5 - 10.5 10*9/L   RBC 4.25 3.20 - 5.50 10*12/L   HGB 10.4 (L) 11.0 - 15.3 g/dL   HCT 68.5 (L) 67.9 - 52.9 %   MCV 74.0 (L) 78.0 - 102.0 fL   MCH 24.4 24.0 - 35.0  pg   MCHC 33.0 31.0 - 36.0 g/dL   RDW 80.1 (H) 87.9 - 83.9 %   MPV 8.4 7.0 - 10.0 fL   Platelet 241 125 - 450 10*9/L   Neutrophils % 50.1 %   Lymphocytes % 36.5 %   Monocytes % 6.9 %   Eosinophils % 5.4 %   Basophils % 1.1 %   Absolute Neutrophils 2.6 1.8 - 8.0 10*9/L   Absolute Lymphocytes 1.9 1.2 - 6.5 10*9/L   Absolute Monocytes 0.3 0.0 - 0.9 10*9/L   Absolute Eosinophils 0.3 0.0 - 0.6 10*9/L   Absolute Basophils 0.1 0.0 - 0.2 10*9/L   Microcytosis Slight (A) Not Present   No results found.  No results found for this visit on 03/30/24 (from the past 4464 hours).  Text in this note was generated using an ambient documentation service.  I discussed the potential to use a recording device to record and summarize our discussion today.  All persons present during the encounter consented to its use.       [1] No Known Allergies [2] Past Medical History: Diagnosis Date  . Abnormal Pap smear of cervix 11/13/2017   HGSIL  . Chlamydia    Negative in this pregnancy  . Gestational diabetes (HHS-HCC)   . History of pre-eclampsia in prior pregnancy, currently pregnant (HHS-HCC)   . Hyperemesis gravidarum (HHS-HCC) 2016  . Oligohydramnios (HHS-HCC) 2009  [3] No past surgical history on file. [4] No family history on file.  Eloise Donnice Standing, [place] 04/17/24 [phone]

## 2024-03-30 NOTE — ED Notes (Signed)
 Pt given discharge papers and verbalized understanding. Pt ambulates to lobby with significant other. NAD noted at this time.

## 2024-04-24 ENCOUNTER — Emergency Department: Payer: Self-pay

## 2024-04-24 ENCOUNTER — Encounter: Payer: Self-pay | Admitting: Emergency Medicine

## 2024-04-24 ENCOUNTER — Other Ambulatory Visit: Payer: Self-pay

## 2024-04-24 ENCOUNTER — Emergency Department
Admission: EM | Admit: 2024-04-24 | Discharge: 2024-04-24 | Disposition: A | Discharge status: Home / Self Care | Payer: Self-pay | Attending: Emergency Medicine | Admitting: Emergency Medicine

## 2024-04-24 DIAGNOSIS — R112 Nausea with vomiting, unspecified: Secondary | ICD-10-CM

## 2024-04-24 DIAGNOSIS — O99612 Diseases of the digestive system complicating pregnancy, second trimester: Secondary | ICD-10-CM | POA: Insufficient documentation

## 2024-04-24 DIAGNOSIS — Z3A15 15 weeks gestation of pregnancy: Secondary | ICD-10-CM | POA: Insufficient documentation

## 2024-04-24 DIAGNOSIS — K2900 Acute gastritis without bleeding: Secondary | ICD-10-CM | POA: Insufficient documentation

## 2024-04-24 DIAGNOSIS — E860 Dehydration: Secondary | ICD-10-CM | POA: Insufficient documentation

## 2024-04-24 LAB — COMPREHENSIVE METABOLIC PANEL WITH GFR
ALT: 28 U/L (ref 0–44)
AST: 26 U/L (ref 15–41)
Albumin: 3.5 g/dL (ref 3.5–5.0)
Alkaline Phosphatase: 64 U/L (ref 38–126)
Anion gap: 11 (ref 5–15)
BUN: 6 mg/dL (ref 6–20)
CO2: 18 mmol/L — ABNORMAL LOW (ref 22–32)
Calcium: 9.1 mg/dL (ref 8.9–10.3)
Chloride: 107 mmol/L (ref 98–111)
Creatinine, Ser: 0.39 mg/dL — ABNORMAL LOW (ref 0.44–1.00)
GFR, Estimated: >60 mL/min (ref >60)
Glucose, Bld: 89 mg/dL (ref 70–99)
Potassium: 3.4 mmol/L — ABNORMAL LOW (ref 3.5–5.1)
Sodium: 136 mmol/L (ref 135–145)
Total Bilirubin: 0.5 mg/dL (ref 0.0–1.2)
Total Protein: 7.4 g/dL (ref 6.5–8.1)

## 2024-04-24 LAB — CBC
HCT: 34.5 % — ABNORMAL LOW (ref 36.0–46.0)
Hemoglobin: 11.2 g/dL — ABNORMAL LOW (ref 12.0–15.0)
MCH: 25.4 pg — ABNORMAL LOW (ref 26.0–34.0)
MCHC: 32.5 g/dL (ref 30.0–36.0)
MCV: 78.2 fL — ABNORMAL LOW (ref 80.0–100.0)
Platelets: 216 K/uL (ref 150–400)
RBC: 4.41 MIL/uL (ref 3.87–5.11)
RDW: 17.7 % — ABNORMAL HIGH (ref 11.5–15.5)
WBC: 7.8 K/uL (ref 4.0–10.5)
nRBC: 0 % (ref 0.0–0.2)

## 2024-04-24 LAB — URINALYSIS, ROUTINE W REFLEX MICROSCOPIC
Bilirubin Urine: NEGATIVE
Glucose, UA: NEGATIVE mg/dL
Hgb urine dipstick: NEGATIVE
Ketones, ur: 80 mg/dL — AB
Leukocytes,Ua: NEGATIVE
Nitrite: NEGATIVE
Protein, ur: 30 mg/dL — AB
Specific Gravity, Urine: 1.033 — ABNORMAL HIGH (ref 1.005–1.030)
pH: 5 (ref 5.0–8.0)

## 2024-04-24 LAB — POC URINE PREG, ED: Preg Test, Ur: POSITIVE — AB

## 2024-04-24 LAB — LIPASE, BLOOD: Lipase: 32 U/L (ref 11–51)

## 2024-04-24 MED ORDER — DEXTROSE 5 % IN LACTATED RINGERS IV BOLUS
2000.0000 mL | Freq: Once | INTRAVENOUS | Status: AC
  Administered 2024-04-24: 2000 mL via INTRAVENOUS

## 2024-04-24 MED ORDER — ACETAMINOPHEN 500 MG PO TABS
1000.0000 mg | ORAL_TABLET | Freq: Once | ORAL | Status: AC
  Administered 2024-04-24: 1000 mg via ORAL
  Filled 2024-04-24: qty 2

## 2024-04-24 MED ORDER — ONDANSETRON HCL 4 MG/2ML IJ SOLN
4.0000 mg | Freq: Once | INTRAMUSCULAR | Status: AC
  Administered 2024-04-24: 4 mg via INTRAVENOUS
  Filled 2024-04-24: qty 2

## 2024-04-24 NOTE — ED Provider Notes (Signed)
 [facility] Provider Note    Event Date/Time   First MD Initiated Contact with Patient 04/24/24 2000     (approximate)   History   Emesis   HPI  Sheryl Stanley is a 33 y.o. female who presents to the ED for evaluation of Emesis   Review outside ED visit from 8/7.  Seen for first trimester vaginal bleeding, [redacted] weeks gestation.  G7 [id number]  Spanish interpreter utilized for history and physical  Patient presents with recurrent emesis throughout the day today, reports innumerable episodes of emesis.  Reports  RUQ abdominal pain and pelvic cramping, spotting yesterday without significant bleeding today.   Physical Exam   Triage Vital Signs: ED Triage Vitals  Encounter Vitals Group     BP 04/24/24 1941 (!) 141/96     Girls Systolic BP Percentile --      Girls Diastolic BP Percentile --      Boys Systolic BP Percentile --      Boys Diastolic BP Percentile --      Pulse Rate 04/24/24 1941 92     Resp 04/24/24 1941 (!) 22     Temp 04/24/24 1941 98 F (36.7 C)     Temp Source 04/24/24 1941 Oral     SpO2 04/24/24 1941 100 %     Weight 04/24/24 1940 198 lb (89.8 kg)     Height 04/24/24 1940 5' 7 (1.702 m)     Head Circumference --      Peak Flow --      Pain Score --      Pain Loc --      Pain Education --      Exclude from Growth Chart --     Most recent vital signs: Vitals:   04/24/24 2308 04/24/24 2309  BP: (!) 102/56   Pulse:  63  Resp:    Temp:    SpO2:  100%    General: Awake, no distress.  CV:  Good peripheral perfusion.  Resp:  Normal effort.  Abd:  No distention.  RUQ tenderness without peritoneal features.  Benign lower abdomen, no tenderness to the RLQ MSK:  No deformity noted.  Neuro:  No focal deficits appreciated. Other:     ED Results / Procedures / Treatments   Labs (all labs ordered are listed, but only abnormal results are displayed) Labs Reviewed  COMPREHENSIVE METABOLIC PANEL WITH GFR - Abnormal; Notable  for the following components:      Result Value   Potassium 3.4 (*)    CO2 18 (*)    Creatinine, Ser 0.39 (*)    All other components within normal limits  CBC - Abnormal; Notable for the following components:   Hemoglobin 11.2 (*)    HCT 34.5 (*)    MCV 78.2 (*)    MCH 25.4 (*)    RDW 17.7 (*)    All other components within normal limits  URINALYSIS, ROUTINE W REFLEX MICROSCOPIC - Abnormal; Notable for the following components:   Color, Urine YELLOW (*)    APPearance HAZY (*)    Specific Gravity, Urine 1.033 (*)    Ketones, ur 80 (*)    Protein, ur 30 (*)    Bacteria, UA RARE (*)    All other components within normal limits  POC URINE PREG, ED - Abnormal; Notable for the following components:   Preg Test, Ur POSITIVE (*)    All other components within normal limits  LIPASE, BLOOD  HCG, QUANTITATIVE,  PREGNANCY    EKG   RADIOLOGY Obstetric ultrasound interpreted by me with IUP RUQ ultrasound interpreted by me without signs of gallstones  Official radiology report(s): US  OB Limited Result Date: 04/24/2024 CLINICAL DATA:  Known pregnancy with pelvic cramping EXAM: LIMITED OBSTETRIC ULTRASOUND COMPARISON:  None Available. FINDINGS: Number of Fetuses: 1 Heart Rate:  150 bpm Movement: Present Presentation: Breech Placental Location: Anterior Previa: No Amniotic Fluid (Subjective):  Normal BPD: 3.1 cm 15 w  5 d MATERNAL FINDINGS: Cervix:  Appears closed. Uterus/Adnexae: No abnormality visualized. IMPRESSION: Single live intrauterine gestation at 15 weeks 5 days. No acute abnormality is noted. This exam is performed on an emergent basis and does not comprehensively evaluate fetal size, dating, or anatomy; follow-up complete OB US  should be considered if further fetal assessment is warranted. Electronically Signed   By: Oneil Devonshire M.D.   On: 04/24/2024 23:06   US  ABDOMEN LIMITED RUQ (LIVER/GB) Result Date: 04/24/2024 CLINICAL DATA:  Pelvic cramping during pregnancy EXAM: ULTRASOUND  ABDOMEN LIMITED RIGHT UPPER QUADRANT COMPARISON:  None Available. FINDINGS: Gallbladder: No gallstones or wall thickening visualized. No sonographic Murphy sign noted by sonographer. Common bile duct: Diameter: 2.5 mm Liver: No focal lesion identified. Within normal limits in parenchymal echogenicity. Portal vein is patent on color Doppler imaging with normal direction of blood flow towards the liver. Other: There is mild right-sided hydronephrosis. IMPRESSION: 1. Mild right-sided hydronephrosis. 2. No other abnormality identified. Electronically Signed   By: Greig Pique M.D.   On: 04/24/2024 22:59    PROCEDURES and INTERVENTIONS:  Procedures  Medications  dextrose  5% lactated ringers  bolus 2,000 mL (0 mLs Intravenous Stopped 04/24/24 2243)  ondansetron  (ZOFRAN ) injection 4 mg (4 mg Intravenous Given 04/24/24 2035)  acetaminophen  (TYLENOL ) tablet 1,000 mg (1,000 mg Oral Given 04/24/24 2242)     IMPRESSION / MDM / ASSESSMENT AND PLAN / ED COURSE  I reviewed the triage vital signs and the nursing notes.  Differential diagnosis includes, but is not limited to, biliary colic, choledocholithiasis, pancreatitis, acute appendicitis, cystitis, dehydration, hyperemesis gravidarum  {Patient presents with symptoms of an acute illness or injury that is potentially life-threatening.  Pregnant patient presents with recurrent emesis and signs of dehydration.  Has some upper abdominal tenderness, possibly of gastric etiology.  Has a normal-appearing gallbladder ultrasound.  It does demonstrate some mild right-sided hydronephrosis but she has no flank pain or urinary symptoms.  UA with many ketones suggestive of dehydration but no signs of infection.  No leukocytosis.  Normal lipase.   Ultrasound of RUQ abdomen and pelvis, as above, reassuring.  She is feeling much better after IV rehydration and Tylenol .  Higher suspicion for gastric etiology of her upper abdominal pain in the setting of recurrent emesis.    Discussed OB/GYN evaluation as an outpatient, OTC medications and ED return precautions.  Patient suitable for outpatient management.  Clinical Course as of 04/24/24 2322  Mon Apr 24, 2024  2321 Reassessed the patient with Spanish interpreter.  We discussed reassuring ultrasounds.  She reports feeling much better.  We discussed possibilities causing her symptoms.  Discussed likely gastritis today managing this with OTC medications.  We discussed following up with OB/GYN.  We discussed ultrasound results with gestation that is nearly 16 weeks.  Answered questions.  She is appreciative.  Suitable for outpatient management. [DS]    Clinical Course User Index [DS] Claudene Rover, MD     FINAL CLINICAL IMPRESSION(S) / ED DIAGNOSES   Final diagnoses:  Nausea and vomiting, unspecified  vomiting type  Dehydration  Acute gastritis without hemorrhage, unspecified gastritis type     Rx / DC Orders   ED Discharge Orders     None        Note:  This document was prepared using Dragon voice recognition software and may include unintentional dictation errors.   Claudene Rover, MD 04/24/24 [phone]

## 2024-04-24 NOTE — ED Triage Notes (Addendum)
 Interpreter used for triage Sheryl Stanley [phone])   Patient reports abdominal pain associated with emesis since 6am this morning. Denies fevers or diarrhea. Patient reports that she is pregnant - unsure of how far along LMP 5/26.

## 2024-04-24 NOTE — Discharge Instructions (Addendum)
 Use Tylenol  for pain and fevers.  Up to 1000 mg per dose, up to 4 times per day.  Do not take more than 4000 mg of Tylenol /acetaminophen  within 24 hours..  Pepcid/famotidine for Acid Reflux. Can also use Tums as needed.

## 2024-04-25 LAB — HCG, QUANTITATIVE, PREGNANCY: hCG, Beta Chain, Quant, S: 45967 m[IU]/mL — ABNORMAL HIGH (ref <5)
